# Patient Record
Sex: Female | Born: 1980 | Race: Black or African American | Hispanic: No | Marital: Single | Smoking: Never smoker
Health system: Southern US, Community
[De-identification: ages and names within clinical notes are randomized; demographics above are authoritative.]

## PROBLEM LIST (undated history)

## (undated) ENCOUNTER — Ambulatory Visit (HOSPITAL_COMMUNITY): Admission: EM | Payer: Medicaid Other

## (undated) DIAGNOSIS — O352XX1 Maternal care for (suspected) hereditary disease in fetus, fetus 1: Secondary | ICD-10-CM

## (undated) HISTORY — PX: ADENOIDECTOMY: SUR15

---

## 2004-04-12 ENCOUNTER — Ambulatory Visit (HOSPITAL_COMMUNITY): Admission: RE | Admit: 2004-04-12 | Discharge: 2004-04-12 | Payer: Self-pay | Admitting: Obstetrics and Gynecology

## 2004-05-07 ENCOUNTER — Ambulatory Visit (HOSPITAL_COMMUNITY): Admission: RE | Admit: 2004-05-07 | Discharge: 2004-05-07 | Payer: Self-pay | Admitting: Obstetrics and Gynecology

## 2004-05-19 ENCOUNTER — Inpatient Hospital Stay (HOSPITAL_COMMUNITY): Admission: AD | Admit: 2004-05-19 | Discharge: 2004-05-21 | Payer: Self-pay | Admitting: Obstetrics and Gynecology

## 2004-08-06 ENCOUNTER — Other Ambulatory Visit: Admission: RE | Admit: 2004-08-06 | Discharge: 2004-08-06 | Payer: Self-pay | Admitting: Obstetrics and Gynecology

## 2004-09-15 ENCOUNTER — Emergency Department (HOSPITAL_COMMUNITY): Admission: EM | Admit: 2004-09-15 | Discharge: 2004-09-15 | Payer: Self-pay | Admitting: Emergency Medicine

## 2005-04-11 ENCOUNTER — Emergency Department (HOSPITAL_COMMUNITY): Admission: EM | Admit: 2005-04-11 | Discharge: 2005-04-11 | Payer: Self-pay | Admitting: Emergency Medicine

## 2006-02-03 ENCOUNTER — Emergency Department (HOSPITAL_COMMUNITY): Admission: EM | Admit: 2006-02-03 | Discharge: 2006-02-04 | Payer: Self-pay | Admitting: Emergency Medicine

## 2010-10-25 ENCOUNTER — Emergency Department (HOSPITAL_COMMUNITY)
Admission: EM | Admit: 2010-10-25 | Discharge: 2010-10-26 | Disposition: A | Discharge status: Home / Self Care | Payer: Self-pay | Attending: Emergency Medicine | Admitting: Emergency Medicine

## 2010-10-25 DIAGNOSIS — R229 Localized swelling, mass and lump, unspecified: Secondary | ICD-10-CM | POA: Insufficient documentation

## 2010-10-25 DIAGNOSIS — M79609 Pain in unspecified limb: Secondary | ICD-10-CM | POA: Insufficient documentation

## 2010-10-25 DIAGNOSIS — J45909 Unspecified asthma, uncomplicated: Secondary | ICD-10-CM | POA: Insufficient documentation

## 2010-10-25 DIAGNOSIS — IMO0002 Reserved for concepts with insufficient information to code with codable children: Secondary | ICD-10-CM | POA: Insufficient documentation

## 2010-12-20 ENCOUNTER — Emergency Department (HOSPITAL_COMMUNITY)
Admission: EM | Admit: 2010-12-20 | Discharge: 2010-12-20 | Disposition: A | Discharge status: Home Health | Payer: Self-pay | Attending: Emergency Medicine | Admitting: Emergency Medicine

## 2010-12-20 ENCOUNTER — Encounter: Payer: Self-pay | Admitting: Emergency Medicine

## 2010-12-20 DIAGNOSIS — H5710 Ocular pain, unspecified eye: Secondary | ICD-10-CM | POA: Insufficient documentation

## 2010-12-20 DIAGNOSIS — S058X1A Other injuries of right eye and orbit, initial encounter: Secondary | ICD-10-CM

## 2010-12-20 DIAGNOSIS — S0590XA Unspecified injury of unspecified eye and orbit, initial encounter: Secondary | ICD-10-CM | POA: Insufficient documentation

## 2010-12-20 DIAGNOSIS — H538 Other visual disturbances: Secondary | ICD-10-CM | POA: Insufficient documentation

## 2010-12-20 DIAGNOSIS — H53149 Visual discomfort, unspecified: Secondary | ICD-10-CM | POA: Insufficient documentation

## 2010-12-20 DIAGNOSIS — IMO0002 Reserved for concepts with insufficient information to code with codable children: Secondary | ICD-10-CM | POA: Insufficient documentation

## 2010-12-20 MED ORDER — POLYMYXIN B-TRIMETHOPRIM 10000-0.1 UNIT/ML-% OP SOLN: 1.0000 [drp] | OPHTHALMIC | Status: AC

## 2010-12-20 MED ORDER — FLUORESCEIN SODIUM 1 MG OP STRP
1.0000 | ORAL_STRIP | Freq: Once | OPHTHALMIC | Status: AC
  Administered 2010-12-20: 06:00:00 via OPHTHALMIC
  Filled 2010-12-20: qty 1

## 2010-12-20 MED ORDER — TETRACAINE HCL 0.5 % OP SOLN
2.0000 [drp] | Freq: Once | OPHTHALMIC | Status: AC
  Administered 2010-12-20: 2 [drp] via OPHTHALMIC
  Filled 2010-12-20: qty 2

## 2010-12-20 NOTE — ED Provider Notes (Signed)
Medical screening examination/treatment/procedure(s) were performed by non-physician practitioner and as supervising physician I was immediately available for consultation/collaboration.  Olivia Mackie, MD 12/20/10 [phone]

## 2010-12-20 NOTE — ED Provider Notes (Signed)
History   Pt presents to ED with 4 days onset of R eye blurry vision and pain with light exposure.  Sts she was walking around [place] 4 days ago and accidentally hits R eye with hanging cord.  Sts initially it wasn't painful but for the past few days she has notice increases blurry vision and eye tenderness with light exposure.  Denies discharge or rash.  Denies pressure behind eye.  Does not wear contact lens.    CSN: [id number] Arrival date & time: 12/20/2010  4:33 AM   First MD Initiated Contact with Patient 12/20/10 0600      Chief Complaint  Patient presents with  . Eye Injury    (Consider location/radiation/quality/duration/timing/severity/associated sxs/prior treatment) Patient is a 30 y.o. female presenting with eye injury. The history is provided by the patient. No language interpreter was used.  Eye Injury This is a new problem. The current episode started in the past 7 days. The problem occurs constantly. The problem has been gradually worsening. Associated symptoms include a visual change. Pertinent negatives include no fever, headaches, neck pain or rash. She has tried nothing for the symptoms.    Past Medical History  Diagnosis Date  . Asthma     History reviewed. No pertinent past surgical history.  No family history on file.  History  Substance Use Topics  . Smoking status: Never Smoker   . Smokeless tobacco: Not on file  . Alcohol Use: No    OB History    Grav Para Term Preterm Abortions TAB SAB Ect Mult Living                  Review of Systems  Constitutional: Negative for fever.  HENT: Negative for neck pain.   Skin: Negative for rash.  Neurological: Negative for headaches.  All other systems reviewed and are negative.    Allergies  Review of patient's allergies indicates no known allergies.  Home Medications  No current outpatient prescriptions on file.  BP 103/73  Pulse 73  Temp(Src) 98.6 F (37 C) (Oral)  Resp 20  SpO2  98%  Physical Exam  Constitutional: She is oriented to person, place, and time. She appears well-developed and well-nourished.  HENT:  Head: Normocephalic and atraumatic.  Eyes: Conjunctivae, EOM and lids are normal. Pupils are equal, round, and reactive to light. No foreign bodies found. Right eye exhibits no chemosis, no discharge, no exudate and no hordeolum. No foreign body present in the right eye. Left eye exhibits no chemosis, no discharge, no exudate and no hordeolum. No foreign body present in the left eye. Right conjunctiva is not injected. Right conjunctiva has no hemorrhage. Left conjunctiva is not injected. Left conjunctiva has no hemorrhage. No scleral icterus.  Slit lamp exam:      The right eye shows no corneal abrasion, no corneal ulcer, no foreign body, no hyphema and no fluorescein uptake.  Neck: Normal range of motion. Neck supple.  Neurological: She is alert and oriented to person, place, and time.  Skin: Skin is warm and intact.    ED Course  Procedures (including critical care time)  Labs Reviewed - No data to display No results found.   No diagnosis found.    MDM  Pt has recent eye injury when accidentally hits R eye with hanging cord.  No obvious finding on exam.  Visual acuity is better in R eye than L left.  As requested, will prescribed eyedrops and referral to opthalmology.  Fayrene Helper, [place] 12/20/10 [phone]

## 2010-12-20 NOTE — ED Notes (Signed)
PT. REPORTS RIGHT EYE PAIN /BLURRED VISION ONSET LAST Wednesday AFTER ACCIDENTALLY HIT RIGHT EYE AGAINST HANGING CORD,  DENIES DRAINAGE.

## 2010-12-20 NOTE — ED Notes (Signed)
ASSUMED CARE ON PT. INTRODUCED SELF , CALL LIGHT WITHIN REACH , EXPLAINED PROCESS, WAITING FOR EDP/PA EVALUATION .

## 2011-12-08 ENCOUNTER — Encounter (HOSPITAL_COMMUNITY): Payer: Self-pay | Admitting: *Deleted

## 2011-12-08 ENCOUNTER — Emergency Department (HOSPITAL_COMMUNITY)
Admission: EM | Admit: 2011-12-08 | Discharge: 2011-12-09 | Disposition: A | Discharge status: Home / Self Care | Payer: Self-pay | Attending: Emergency Medicine | Admitting: Emergency Medicine

## 2011-12-08 DIAGNOSIS — N949 Unspecified condition associated with female genital organs and menstrual cycle: Secondary | ICD-10-CM | POA: Insufficient documentation

## 2011-12-08 DIAGNOSIS — J45909 Unspecified asthma, uncomplicated: Secondary | ICD-10-CM | POA: Insufficient documentation

## 2011-12-08 DIAGNOSIS — A5901 Trichomonal vulvovaginitis: Secondary | ICD-10-CM | POA: Insufficient documentation

## 2011-12-08 DIAGNOSIS — N898 Other specified noninflammatory disorders of vagina: Secondary | ICD-10-CM | POA: Insufficient documentation

## 2011-12-08 DIAGNOSIS — Z202 Contact with and (suspected) exposure to infections with a predominantly sexual mode of transmission: Secondary | ICD-10-CM | POA: Insufficient documentation

## 2011-12-08 LAB — WET PREP, GENITAL

## 2011-12-08 LAB — URINALYSIS, ROUTINE W REFLEX MICROSCOPIC
Bilirubin Urine: NEGATIVE
Nitrite: NEGATIVE
Protein, ur: NEGATIVE mg/dL

## 2011-12-08 LAB — URINE MICROSCOPIC-ADD ON

## 2011-12-08 MED ORDER — CEFTRIAXONE SODIUM 250 MG IJ SOLR
250.0000 mg | Freq: Once | INTRAMUSCULAR | Status: AC
  Administered 2011-12-08: 250 mg via INTRAMUSCULAR
  Filled 2011-12-08: qty 250

## 2011-12-08 MED ORDER — LIDOCAINE HCL (PF) 1 % IJ SOLN
5.0000 mL | Freq: Once | INTRAMUSCULAR | Status: AC
  Administered 2011-12-08: 1.1 mL
  Filled 2011-12-08: qty 5

## 2011-12-08 MED ORDER — METRONIDAZOLE 500 MG PO TABS: 500.0000 mg | ORAL_TABLET | Freq: Two times a day (BID) | ORAL | Status: DC

## 2011-12-08 MED ORDER — AZITHROMYCIN 1 G PO PACK
1.0000 g | PACK | Freq: Once | ORAL | Status: AC
  Administered 2011-12-08: 1 g via ORAL
  Filled 2011-12-08: qty 1

## 2011-12-08 NOTE — ED Notes (Addendum)
Patient with two weeks of vaginal discharge with odor and about two days ago she started to feel tingling sensation in her vaginal area.  Patient is requesting to be evaluated for stds

## 2011-12-08 NOTE — ED Provider Notes (Signed)
History     CSN: [id number]  Arrival date & time 12/08/11  2149   First MD Initiated Contact with Patient 12/08/11 2158      Chief Complaint  Patient presents with  . Vaginal Discharge    (Consider location/radiation/quality/duration/timing/severity/associated sxs/prior treatment) HPIShanna Lopez is a 31 y.o. female is concerned because she thinks she may have been exposed to an STI because her boyfriend was "messing around with baby mama" and over the last few days she has noticed a foul smelling vaginal discharge that is occasionally yellow. She's had some lower pelvic pain, she's had no nausea or vomiting, she describes his pain as mild to moderate, no vaginal itching, no pain on intercourse, no fevers or chills chest pain or shortness of breath. Symptoms have been constant and unchanged she has not taken anything for them. Her boyfriend is here in the ER for the similar complaints   Past Medical History  Diagnosis Date  . Asthma     History reviewed. No pertinent past surgical history.  No family history on file.  History  Substance Use Topics  . Smoking status: Never Smoker   . Smokeless tobacco: Not on file  . Alcohol Use: No    OB History    Grav Para Term Preterm Abortions TAB SAB Ect Mult Living                  Review of Systems At least 10pt or greater review of systems completed and are negative except where specified in the HPI.  Allergies  Review of patient's allergies indicates no known allergies.  Home Medications   Current Outpatient Rx  Name  Route  Sig  Dispense  Refill  . ALBUTEROL SULFATE HFA 108 (90 BASE) MCG/ACT IN AERS   Inhalation   Inhale 2 puffs into the lungs every 6 (six) hours as needed. For wheezing         . METRONIDAZOLE 500 MG PO TABS   Oral   Take 1 tablet (500 mg total) by mouth 2 (two) times daily.   14 tablet   0     BP 117/87  Pulse 75  Temp 98.6 F (37 C) (Oral)  Resp 16  SpO2 98%  Physical Exam  Nursing  notes reviewed.  Electronic medical record reviewed. VITAL SIGNS:   Filed Vitals:   12/08/11 2155  BP: 117/87  Pulse: 75  Temp: 98.6 F (37 C)  TempSrc: Oral  Resp: 16  SpO2: 98%   CONSTITUTIONAL: Awake, oriented, appears non-toxic HENT: Atraumatic, normocephalic, oral mucosa pink and moist, airway patent. Nares patent without drainage. External ears normal. EYES: Conjunctiva clear, EOMI, PERRLA NECK: Trachea midline, non-tender, supple CARDIOVASCULAR: Normal heart rate, Normal rhythm, No murmurs, rubs, gallops PULMONARY/CHEST: Clear to auscultation, no rhonchi, wheezes, or rales. Symmetrical breath sounds. Non-tender. ABDOMINAL: Non-distended, obese, soft, non-tender - no rebound or guarding.  BS normal. NEUROLOGIC: Non-focal, moving all four extremities, no gross sensory or motor deficits. EXTREMITIES: No clubbing, cyanosis, or edema SKIN: Warm, Dry, No erythema, No rash PELVIC EXAM: normal external genitalia, discharge at vulva, vagina has a yellowish mucoid discharge in the vault, cervix appears normal without CMT, uterus and adnexa difficult to palpate due to patient's body habitus ED Course  Procedures (including critical care time)  Labs Reviewed  URINALYSIS, ROUTINE W REFLEX MICROSCOPIC - Abnormal; Notable for the following:    Hgb urine dipstick SMALL (*)     All other components within normal limits  WET PREP, GENITAL -  Abnormal; Notable for the following:    Trich, Wet Prep MODERATE (*)     Clue Cells Wet Prep HPF POC FEW (*)     All other components within normal limits  POCT PREGNANCY, URINE  URINE MICROSCOPIC-ADD ON  GC/CHLAMYDIA PROBE AMP   No results found.   1. Trichomonal vulvovaginitis   2. Exposure to sexually transmitted disease (STD)   3. Vaginal discharge       MDM  Mikayla Lopez is a 31 y.o. female presenting with symptoms consistent with sexually transmitted infection. Pelvic exam her is consistent with STI -  We'll treat for trichomoniasis, GC  and Chlamydia. Wet prep does show trichomonas, discussed treatment plan with patient she agrees and understands. She understands not to drink alcohol while taking Flagyl. Also encouraged the patient to get tested for HIV and syphilis.  Patient being treated in the ER now.  I explained the diagnosis and have given explicit precautions to return to the ER including any other new or worsening symptoms. The patient understands and accepts the medical plan as it's been dictated and I have answered their questions. Discharge instructions concerning home care and prescriptions have been given.  The patient is STABLE and is discharged to home in good condition.          Jones Skene, MD 12/08/11 2346

## 2011-12-09 LAB — GC/CHLAMYDIA PROBE AMP
CT Probe RNA: NEGATIVE
GC Probe RNA: NEGATIVE

## 2012-03-16 ENCOUNTER — Emergency Department (HOSPITAL_COMMUNITY)
Admission: EM | Admit: 2012-03-16 | Discharge: 2012-03-17 | Disposition: A | Discharge status: Home / Self Care | Payer: Self-pay | Attending: Emergency Medicine | Admitting: Emergency Medicine

## 2012-03-16 ENCOUNTER — Encounter (HOSPITAL_COMMUNITY): Payer: Self-pay | Admitting: Emergency Medicine

## 2012-03-16 DIAGNOSIS — J45909 Unspecified asthma, uncomplicated: Secondary | ICD-10-CM | POA: Insufficient documentation

## 2012-03-16 DIAGNOSIS — R630 Anorexia: Secondary | ICD-10-CM | POA: Insufficient documentation

## 2012-03-16 DIAGNOSIS — K5289 Other specified noninfective gastroenteritis and colitis: Secondary | ICD-10-CM | POA: Insufficient documentation

## 2012-03-16 DIAGNOSIS — K529 Noninfective gastroenteritis and colitis, unspecified: Secondary | ICD-10-CM

## 2012-03-16 DIAGNOSIS — K920 Hematemesis: Secondary | ICD-10-CM | POA: Insufficient documentation

## 2012-03-16 DIAGNOSIS — N760 Acute vaginitis: Secondary | ICD-10-CM | POA: Insufficient documentation

## 2012-03-16 DIAGNOSIS — Z3202 Encounter for pregnancy test, result negative: Secondary | ICD-10-CM | POA: Insufficient documentation

## 2012-03-16 DIAGNOSIS — B9689 Other specified bacterial agents as the cause of diseases classified elsewhere: Secondary | ICD-10-CM

## 2012-03-16 LAB — CBC WITH DIFFERENTIAL/PLATELET
Basophils Relative: 0 % (ref 0–1)
Eosinophils Relative: 0 % (ref 0–5)
Lymphocytes Relative: 15 % (ref 12–46)
MCV: 83.6 fL (ref 78.0–100.0)
Monocytes Absolute: 0.2 10*3/uL (ref 0.1–1.0)
Neutro Abs: 4.3 10*3/uL (ref 1.7–7.7)
Neutrophils Relative %: 81 % — ABNORMAL HIGH (ref 43–77)
Platelets: 298 10*3/uL (ref 150–400)
RBC: 4.58 MIL/uL (ref 3.87–5.11)

## 2012-03-16 LAB — POCT PREGNANCY, URINE: Preg Test, Ur: NEGATIVE

## 2012-03-16 LAB — WET PREP, GENITAL
Trich, Wet Prep: NONE SEEN
Yeast Wet Prep HPF POC: NONE SEEN

## 2012-03-16 LAB — URINALYSIS, ROUTINE W REFLEX MICROSCOPIC
Hgb urine dipstick: NEGATIVE
Ketones, ur: NEGATIVE mg/dL
Nitrite: NEGATIVE
Specific Gravity, Urine: 1.011 (ref 1.005–1.030)
Urobilinogen, UA: 0.2 mg/dL (ref 0.0–1.0)

## 2012-03-16 LAB — BASIC METABOLIC PANEL
BUN: 7 mg/dL (ref 6–23)
Chloride: 103 mEq/L (ref 96–112)
Creatinine, Ser: 0.58 mg/dL (ref 0.50–1.10)
Potassium: 3.7 mEq/L (ref 3.5–5.1)

## 2012-03-16 LAB — LIPASE, BLOOD: Lipase: 30 U/L (ref 11–59)

## 2012-03-16 MED ORDER — IOHEXOL 300 MG/ML  SOLN
20.0000 mL | INTRAMUSCULAR | Status: AC
  Administered 2012-03-16: 50 mL via ORAL

## 2012-03-16 MED ORDER — SODIUM CHLORIDE 0.9 % IV BOLUS (SEPSIS)
1000.0000 mL | INTRAVENOUS | Status: AC
  Administered 2012-03-16: 1000 mL via INTRAVENOUS

## 2012-03-16 MED ORDER — SODIUM CHLORIDE 0.9 % IV SOLN
Freq: Once | INTRAVENOUS | Status: AC
  Administered 2012-03-16: 21:00:00 via INTRAVENOUS

## 2012-03-16 MED ORDER — MORPHINE SULFATE 4 MG/ML IJ SOLN
6.0000 mg | Freq: Once | INTRAMUSCULAR | Status: AC
  Administered 2012-03-16: 6 mg via INTRAVENOUS
  Filled 2012-03-16: qty 2

## 2012-03-16 MED ORDER — ONDANSETRON HCL 4 MG/2ML IJ SOLN
4.0000 mg | Freq: Once | INTRAMUSCULAR | Status: AC
  Administered 2012-03-16: 4 mg via INTRAVENOUS
  Filled 2012-03-16: qty 2

## 2012-03-16 NOTE — ED Notes (Signed)
Pt placed back on cardiac monitor.

## 2012-03-16 NOTE — ED Notes (Signed)
Pt still drinking 1st cup of contrast at this time.

## 2012-03-16 NOTE — ED Provider Notes (Signed)
Plains of periumbilical pain onset approximately 11 AM today constant patient states she threw up a small amount blood work today she is presently admits to anorexia. Last bowel movement today,, no diarrhea. No fever. No other complaint on exam alert nontoxic appearing abdomen obese mild diffuse tenderness normal bowel sounds  Doug Sou, MD 03/16/12 2108

## 2012-03-16 NOTE — ED Notes (Signed)
Pelvic cart by bedside. ?

## 2012-03-16 NOTE — ED Notes (Signed)
Pelvic setup by bedside. MD notified.

## 2012-03-16 NOTE — ED Notes (Signed)
Pt c/o N/V and abd pain starting today; pt sts she may have thrown up some blood

## 2012-03-16 NOTE — ED Provider Notes (Signed)
History     CSN: [id number]  Arrival date & time 03/16/12  1749   First MD Initiated Contact with Patient 03/16/12 1855      Chief Complaint  Patient presents with  . Abdominal Pain  . Emesis    (Consider location/radiation/quality/duration/timing/severity/associated sxs/prior treatment) Patient is a 32 y.o. female presenting with GI illness and abdominal pain. The history is provided by the patient. No language interpreter was used.  GI Problem This is a new problem. The current episode started today. The problem occurs constantly. The problem has been unchanged. Associated symptoms include abdominal pain, anorexia, a change in bowel habit, nausea and vomiting. Pertinent negatives include no arthralgias, chest pain, chills, congestion, coughing, diaphoresis, fatigue, fever, headaches, neck pain, numbness, sore throat or weakness. Nothing aggravates the symptoms. She has tried nothing for the symptoms. The treatment provided no relief.  Abdominal Pain Pain location:  Periumbilical Pain quality: cramping   Pain radiates to:  Does not radiate Pain severity:  Severe Onset quality:  Gradual Duration:  17 hours Timing:  Constant Progression:  Unchanged Chronicity:  New Context: suspicious food intake   Relieved by:  Nothing Worsened by:  Nothing tried Associated symptoms: anorexia, hematemesis, nausea and vomiting   Associated symptoms: no belching, no chest pain, no chills, no constipation, no cough, no diarrhea, no dysuria, no fatigue, no fever, no shortness of breath, no sore throat, no vaginal bleeding and no vaginal discharge  Hematuria: possibly.   Nausea:    Severity:  Moderate   Onset quality:  Gradual   Timing:  Constant Vomiting:    Quality:  Undigested food and bright red blood   Number of occurrences:  3   Severity:  Moderate   Timing:  Intermittent   Progression:  Resolved   Past Medical History  Diagnosis Date  . Asthma     History reviewed. No pertinent  past surgical history.  History reviewed. No pertinent family history.  History  Substance Use Topics  . Smoking status: Never Smoker   . Smokeless tobacco: Not on file  . Alcohol Use: No    OB History   Grav Para Term Preterm Abortions TAB SAB Ect Mult Living                  Review of Systems  Constitutional: Negative for fever, chills, diaphoresis, activity change, appetite change and fatigue.  HENT: Negative for congestion, sore throat, facial swelling, rhinorrhea, neck pain and neck stiffness.   Eyes: Negative for photophobia and discharge.  Respiratory: Negative for cough, chest tightness and shortness of breath.   Cardiovascular: Negative for chest pain, palpitations and leg swelling.  Gastrointestinal: Positive for nausea, vomiting, abdominal pain, anorexia, change in bowel habit and hematemesis. Negative for diarrhea and constipation.  Endocrine: Negative for polydipsia and polyuria.  Genitourinary: Negative for dysuria, frequency, vaginal bleeding, vaginal discharge, difficulty urinating and pelvic pain. Hematuria: possibly.  Musculoskeletal: Negative for back pain and arthralgias.  Skin: Negative for color change and wound.  Allergic/Immunologic: Negative for immunocompromised state.  Neurological: Negative for facial asymmetry, weakness, numbness and headaches.  Hematological: Does not bruise/bleed easily.  Psychiatric/Behavioral: Negative for confusion and agitation.    Allergies  Review of patient's allergies indicates no known allergies.  Home Medications   Current Outpatient Rx  Name  Route  Sig  Dispense  Refill  . metroNIDAZOLE (FLAGYL) 500 MG tablet   Oral   Take 1 tablet (500 mg total) by mouth 2 (two) times daily. One po  bid x 7 days   14 tablet   0   . ondansetron (ZOFRAN) 4 MG tablet   Oral   Take 1 tablet (4 mg total) by mouth every 6 (six) hours.   12 tablet   0     BP 105/73  Pulse 57  Temp(Src) 98.4 F (36.9 C) (Oral)  Resp 16   SpO2 100%  Physical Exam  Constitutional: She is oriented to person, place, and time. She appears well-developed and well-nourished. No distress.  HENT:  Head: Normocephalic and atraumatic.  Mouth/Throat: No oropharyngeal exudate.  Eyes: Pupils are equal, round, and reactive to light.  Neck: Normal range of motion. Neck supple.  Cardiovascular: Normal rate, regular rhythm and normal heart sounds.  Exam reveals no gallop and no friction rub.   No murmur heard. Pulmonary/Chest: Effort normal and breath sounds normal. No respiratory distress. She has no wheezes. She has no rales.  Abdominal: Soft. Bowel sounds are normal. She exhibits no distension and no mass. There is tenderness in the periumbilical area. There is no rigidity, no rebound and no guarding.  Musculoskeletal: Normal range of motion. She exhibits no edema and no tenderness.  Neurological: She is alert and oriented to person, place, and time.  Skin: Skin is warm and dry.  Psychiatric: She has a normal mood and affect.    ED Course  Procedures (including critical care time)  Labs Reviewed  WET PREP, GENITAL - Abnormal; Notable for the following:    Clue Cells Wet Prep HPF POC FEW (*)    WBC, Wet Prep HPF POC MODERATE (*)    All other components within normal limits  CBC WITH DIFFERENTIAL - Abnormal; Notable for the following:    Neutrophils Relative 81 (*)    All other components within normal limits  GC/CHLAMYDIA PROBE AMP  BASIC METABOLIC PANEL  LIPASE, BLOOD  URINALYSIS, ROUTINE W REFLEX MICROSCOPIC  POCT PREGNANCY, URINE   Ct Abdomen Pelvis W Contrast  03/17/2012  *RADIOLOGY REPORT*  Clinical Data: Nausea, vomiting, and abdominal pain.  Possible hematemesis.  CT ABDOMEN AND PELVIS WITH CONTRAST  Technique:  Multidetector CT imaging of the abdomen and pelvis was performed following the standard protocol during bolus administration of intravenous contrast.  Contrast: OMNIPAQUE IOHEXOL 300 MG/ML  SOLN  Comparison:  None.  Findings: Slight fibrosis or atelectasis in the lung bases.  There is a focal sub centimeter low attenuation lesion in the anterior segment of the right lobe of the liver.  This is nonspecific but likely represent a small cyst or hemangioma.  No other focal liver lesions are demonstrated.  The spleen, gallbladder, adrenal glands, kidneys, abdominal aorta, and retroperitoneal lymph nodes are unremarkable.  Pancreas is normal. The stomach demonstrates normal distension without wall thickening. Small bowel are mostly decompressed.  Stool filled colon without abnormal distension.  There are air-fluid levels in the proximal colon suggesting liquid stool.  This may be due to infectious or inflammatory process.  No free air or free fluid in the abdomen. Mesenteric lymph nodes are present without pathologic enlargement, likely reactive.  Pelvis:  The bladder is decompressed.  Intrauterine device present. The no evidence of enlargement of the uterus or adnexal structures. No free or loculated pelvic fluid collections.  No significant pelvic lymphadenopathy.  Normal alignment of the lumbar vertebrae. No destructive bone lesions appreciated.  IMPRESSION: Liquid stool in the colon may be associated with infectious or inflammatory process.  No colonic wall thickening.  No evidence of bowel obstruction.  Original Report Authenticated By: Burman Nieves, M.D.      1. Gastroenteritis   2. Bacterial vaginosis       MDM  Pt is a 31 y.o. female with pertinent PMHX of asthma who presents with ab pain, n/v beginning about 17 hrs ago.  Pt had eaten [employer] about 1-2 hours prior.  She has had 3 episodes of emesis total with the third containing several cm's BRB.  No emesis since about 2pm (4 hours).  Ab pain described at periumbilical cramping pain w/o exacerbating or alleviating symptoms.  +anorexia, +dysuria, + loose stool today.  No vag bleeding or d/c, fever.  VSS, pt uncomfortable but nontoxic appearing on exam.   +generalized ttp of abdomen, worse at umbilicus.  Nml bs, no rebound or guarding. Pt reports visitor who is with her ate same thing last night and felt unwell this morning w/o nausea, vomiting, diarrhea.  Given partner with milder symptoms, I feel viral gastroenteritis or food poisoning most likely diagnosis and presence of hematemesis likely Mallory-Weiss tear; however, cannot r/o early appendicitis, PID.  CT ab pelvis ordered and will do pelvic exam.    11:03 PM Pelvic done with moderate white/yellow discharge.  Pt still drinking PO contrast.  CT w/ liquid stool in colon, otherwise benign.  I believe symptoms likely due to gastroenteritis.  Will d/c home w/ rx for zofran & flagyl.  Return precautions given for new or worsening symptoms.   1. Gastroenteritis   2. Bacterial vaginosis      Labs and imaging considered in decision making, reviewed by myself.  Imaging interpreted by radiology. Pt care discussed with my attending, Dr. Ethelda Chick.         Toy Cookey, MD 03/17/12 [phone]

## 2012-03-16 NOTE — ED Notes (Signed)
Pt still drinking first cup of contrast.

## 2012-03-16 NOTE — ED Notes (Signed)
MD at bedside for pelvic exam.

## 2012-03-16 NOTE — ED Notes (Signed)
CT paged - Pt currently drinking 2nd cup of oral contrast.

## 2012-03-17 ENCOUNTER — Emergency Department (HOSPITAL_COMMUNITY): Payer: Self-pay

## 2012-03-17 ENCOUNTER — Encounter (HOSPITAL_COMMUNITY): Payer: Self-pay | Admitting: Radiology

## 2012-03-17 MED ORDER — IOHEXOL 300 MG/ML  SOLN
100.0000 mL | Freq: Once | INTRAMUSCULAR | Status: AC | PRN
  Administered 2012-03-17: 100 mL via INTRAVENOUS

## 2012-03-17 MED ORDER — ONDANSETRON HCL 4 MG PO TABS: 4.0000 mg | ORAL_TABLET | Freq: Four times a day (QID) | ORAL | Status: DC

## 2012-03-17 MED ORDER — METRONIDAZOLE 500 MG PO TABS: 500.0000 mg | ORAL_TABLET | Freq: Two times a day (BID) | ORAL | Status: DC

## 2012-03-17 NOTE — ED Notes (Signed)
Pt placed back on the monitor.

## 2012-03-17 NOTE — ED Notes (Signed)
Pt transported to CT ?

## 2012-03-17 NOTE — ED Provider Notes (Signed)
I have personally seen and examined the patient.  I have discussed the plan of care with the resident.  I have reviewed the documentation on PMH/FH/Soc. History.  I have reviewed the documentation of the resident and agree.  Doug Sou, MD 03/17/12 [phone]

## 2012-03-17 NOTE — ED Notes (Signed)
Pt returned from CT °

## 2012-03-19 LAB — GC/CHLAMYDIA PROBE AMP: CT Probe RNA: NEGATIVE

## 2012-09-12 ENCOUNTER — Emergency Department (HOSPITAL_COMMUNITY): Payer: No Typology Code available for payment source

## 2012-09-12 ENCOUNTER — Emergency Department (HOSPITAL_COMMUNITY)
Admission: EM | Admit: 2012-09-12 | Discharge: 2012-09-12 | Disposition: A | Discharge status: Home / Self Care | Payer: No Typology Code available for payment source | Attending: Emergency Medicine | Admitting: Emergency Medicine

## 2012-09-12 ENCOUNTER — Encounter (HOSPITAL_COMMUNITY): Payer: Self-pay | Admitting: Emergency Medicine

## 2012-09-12 DIAGNOSIS — S0993XA Unspecified injury of face, initial encounter: Secondary | ICD-10-CM | POA: Insufficient documentation

## 2012-09-12 DIAGNOSIS — S0100XA Unspecified open wound of scalp, initial encounter: Secondary | ICD-10-CM | POA: Insufficient documentation

## 2012-09-12 DIAGNOSIS — Z23 Encounter for immunization: Secondary | ICD-10-CM | POA: Insufficient documentation

## 2012-09-12 DIAGNOSIS — S298XXA Other specified injuries of thorax, initial encounter: Secondary | ICD-10-CM | POA: Insufficient documentation

## 2012-09-12 DIAGNOSIS — S3981XA Other specified injuries of abdomen, initial encounter: Secondary | ICD-10-CM | POA: Insufficient documentation

## 2012-09-12 DIAGNOSIS — J45909 Unspecified asthma, uncomplicated: Secondary | ICD-10-CM | POA: Insufficient documentation

## 2012-09-12 DIAGNOSIS — S0191XA Laceration without foreign body of unspecified part of head, initial encounter: Secondary | ICD-10-CM

## 2012-09-12 DIAGNOSIS — T148XXA Other injury of unspecified body region, initial encounter: Secondary | ICD-10-CM

## 2012-09-12 DIAGNOSIS — R109 Unspecified abdominal pain: Secondary | ICD-10-CM | POA: Insufficient documentation

## 2012-09-12 DIAGNOSIS — S5010XA Contusion of unspecified forearm, initial encounter: Secondary | ICD-10-CM | POA: Insufficient documentation

## 2012-09-12 DIAGNOSIS — IMO0002 Reserved for concepts with insufficient information to code with codable children: Secondary | ICD-10-CM | POA: Insufficient documentation

## 2012-09-12 LAB — URINALYSIS, ROUTINE W REFLEX MICROSCOPIC
Glucose, UA: NEGATIVE mg/dL
Ketones, ur: NEGATIVE mg/dL
Protein, ur: NEGATIVE mg/dL

## 2012-09-12 LAB — URINE MICROSCOPIC-ADD ON

## 2012-09-12 MED ORDER — MORPHINE SULFATE 4 MG/ML IJ SOLN
4.0000 mg | Freq: Once | INTRAMUSCULAR | Status: AC
  Administered 2012-09-12: 4 mg via INTRAVENOUS
  Filled 2012-09-12: qty 1

## 2012-09-12 MED ORDER — OXYCODONE-ACETAMINOPHEN 5-325 MG PO TABS: 2.0000 | ORAL_TABLET | ORAL | Status: DC | PRN

## 2012-09-12 MED ORDER — IOHEXOL 300 MG/ML  SOLN
80.0000 mL | Freq: Once | INTRAMUSCULAR | Status: AC | PRN
  Administered 2012-09-12: 80 mL via INTRAVENOUS

## 2012-09-12 MED ORDER — SODIUM CHLORIDE 0.9 % IV BOLUS (SEPSIS)
1000.0000 mL | Freq: Once | INTRAVENOUS | Status: AC
  Administered 2012-09-12: 1000 mL via INTRAVENOUS

## 2012-09-12 MED ORDER — TETANUS-DIPHTH-ACELL PERTUSSIS 5-2.5-18.5 LF-MCG/0.5 IM SUSP
0.5000 mL | Freq: Once | INTRAMUSCULAR | Status: AC
  Administered 2012-09-12: 0.5 mL via INTRAMUSCULAR
  Filled 2012-09-12: qty 0.5

## 2012-09-12 NOTE — ED Notes (Signed)
[place], MD at bedside with suture cart.

## 2012-09-12 NOTE — ED Notes (Signed)
Pt walked with steady with assitance with nurse tech.she was crying.walking slow  In much pain.
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
## 2012-09-12 NOTE — ED Notes (Signed)
Pt states that she is having neck and back pain. Her back hurts to the touch in the lower mid and left sides, as well as the upper right side. No visible injury or bruising. Pt also has a 1 cm laceration on the right side oh her forehead.

## 2012-10-22 DIAGNOSIS — R0789 Other chest pain: Secondary | ICD-10-CM | POA: Insufficient documentation

## 2012-10-22 DIAGNOSIS — R21 Rash and other nonspecific skin eruption: Secondary | ICD-10-CM | POA: Insufficient documentation

## 2012-10-22 DIAGNOSIS — J45909 Unspecified asthma, uncomplicated: Secondary | ICD-10-CM | POA: Insufficient documentation

## 2012-10-23 ENCOUNTER — Emergency Department (HOSPITAL_COMMUNITY)
Admission: EM | Admit: 2012-10-23 | Discharge: 2012-10-23 | Disposition: A | Discharge status: Home / Self Care | Payer: Self-pay | Attending: Emergency Medicine | Admitting: Emergency Medicine

## 2012-10-23 ENCOUNTER — Encounter (HOSPITAL_COMMUNITY): Payer: Self-pay | Admitting: Adult Health

## 2012-10-23 DIAGNOSIS — T7840XA Allergy, unspecified, initial encounter: Secondary | ICD-10-CM

## 2012-10-23 LAB — CBC
HCT: 37.7 % (ref 36.0–46.0)
RDW: 13.7 % (ref 11.5–15.5)
WBC: 5.3 10*3/uL (ref 4.0–10.5)

## 2012-10-23 LAB — POCT I-STAT, CHEM 8
BUN: 11 mg/dL (ref 6–23)
Calcium, Ion: 1.2 mmol/L (ref 1.12–1.23)
Chloride: 107 mEq/L (ref 96–112)
Glucose, Bld: 111 mg/dL — ABNORMAL HIGH (ref 70–99)
HCT: 40 % (ref 36.0–46.0)
Sodium: 140 mEq/L (ref 135–145)
TCO2: 23 mmol/L (ref 0–100)

## 2012-10-23 MED ORDER — DIPHENHYDRAMINE HCL 25 MG PO CAPS
50.0000 mg | ORAL_CAPSULE | Freq: Once | ORAL | Status: AC
  Administered 2012-10-23: 50 mg via ORAL
  Filled 2012-10-23: qty 2

## 2012-10-23 MED ORDER — PREDNISONE 20 MG PO TABS
60.0000 mg | ORAL_TABLET | Freq: Once | ORAL | Status: AC
  Administered 2012-10-23: 60 mg via ORAL
  Filled 2012-10-23: qty 3

## 2012-10-23 MED ORDER — ALBUTEROL SULFATE HFA 108 (90 BASE) MCG/ACT IN AERS
2.0000 | INHALATION_SPRAY | RESPIRATORY_TRACT | Status: DC | PRN
  Administered 2012-10-23: 2 via RESPIRATORY_TRACT
  Filled 2012-10-23: qty 6.7

## 2012-10-23 MED ORDER — PERMETHRIN 5 % EX CREA: TOPICAL_CREAM | CUTANEOUS | Status: DC

## 2012-10-23 MED ORDER — DIPHENHYDRAMINE HCL 25 MG PO TABS: 25.0000 mg | ORAL_TABLET | Freq: Four times a day (QID) | ORAL | Status: DC

## 2012-10-23 MED ORDER — PREDNISONE 20 MG PO TABS: 60.0000 mg | ORAL_TABLET | Freq: Every day | ORAL | Status: DC

## 2012-10-23 MED ORDER — FAMOTIDINE 20 MG PO TABS
40.0000 mg | ORAL_TABLET | Freq: Once | ORAL | Status: AC
  Administered 2012-10-23: 40 mg via ORAL
  Filled 2012-10-23: qty 2

## 2012-10-23 NOTE — ED Notes (Addendum)
Presents with rash to back, bilateral forearms, bilateral legs and hands. Began last night, pt c/o feeling itchy. Sats 100% RA, bilateral breath sounds clear. Pt is unsure what is causing reaction. She feeling like her chest is tight when breathing.

## 2012-10-23 NOTE — ED Provider Notes (Signed)
CSN: [id number]     Arrival date & time 10/22/12  2347 History   First MD Initiated Contact with Patient 10/23/12 [phone]     Chief Complaint  Patient presents with  . Allergic Reaction   (Consider location/radiation/quality/duration/timing/severity/associated sxs/prior Treatment) HPI History provided by patient. Has a history of asthma but currently out of her inhaler and does not take any other medications. No new soaps or known exposures. Yesterday developed itchy rash involving the extremities and torso. She had some chest tightness earlier now resolved. No throat, tongue or lip swelling. No new foods or detergents. No known sick contacts. No recent travel. Past Medical History  Diagnosis Date  . Asthma    History reviewed. No pertinent past surgical history. History reviewed. No pertinent family history. History  Substance Use Topics  . Smoking status: Never Smoker   . Smokeless tobacco: Not on file  . Alcohol Use: Yes     Comment: Pt states socially   OB History   Grav Para Term Preterm Abortions TAB SAB Ect Mult Living                 Review of Systems  Constitutional: Negative for fever and chills.  HENT: Negative for neck pain and neck stiffness.   Eyes: Negative for pain.  Respiratory: Positive for chest tightness. Negative for shortness of breath.   Cardiovascular: Negative for chest pain.  Gastrointestinal: Negative for abdominal pain.  Genitourinary: Negative for dysuria.  Musculoskeletal: Negative for back pain.  Skin: Positive for rash.  Neurological: Negative for headaches.  All other systems reviewed and are negative.    Allergies  Review of patient's allergies indicates no known allergies.  Home Medications  No current outpatient prescriptions on file. BP 171/149  Pulse 18  Temp(Src) 98.1 F (36.7 C) (Oral)  Resp 18  Ht 5\' 3"  (1.6 m)  Wt 179 lb (81.194 kg)  BMI 31.72 kg/m2  SpO2 100% Physical Exam  Constitutional: She is oriented to person,  place, and time. She appears well-developed and well-nourished.  HENT:  Head: Normocephalic and atraumatic.  Eyes: EOM are normal. Pupils are equal, round, and reactive to light.  Neck: Neck supple.  Cardiovascular: Normal rate, regular rhythm and intact distal pulses.   Pulmonary/Chest: Effort normal and breath sounds normal. No respiratory distress. She has no wheezes. She exhibits no tenderness.  Abdominal: Soft. Bowel sounds are normal. There is no tenderness.  Musculoskeletal: Normal range of motion. She exhibits no edema.  Neurological: She is alert and oriented to person, place, and time.  Skin: Skin is warm and dry.  Erythematous rash to torso and extremities sparing the palms. No oral lesions. Excoriated areas of rash are 5-10 mm in diameter, macular, nontender    ED Course  Procedures (including critical care time) Labs Review Labs Reviewed  POCT I-STAT, CHEM 8 - Abnormal; Notable for the following:    Glucose, Bld 111 (*)    All other components within normal limits  CBC    Albuterol inhaler provided. Prednisone, Benadryl, Pepcid provided  Symptomatically improved on recheck. No airway involvement.  Plan discharge home with prescription for prednisone and Benadryl as needed. Strict return precautions verbalized is understood. Patient is here with significant other who was recently treated for scabies, prescription for Elimite provided MDM  Diagnosis: Allergic reaction  Labs and vital signs and nursing notes reviewed Improved with medications  Sunnie Nielsen, MD 10/23/12 [phone]

## 2012-10-23 NOTE — ED Notes (Signed)
Upon arriving into pts room, pt is sleeping. This RN attempted to wake pt up and pt opened her eyes but would not sit up or talk to this RN for the assessment.

## 2013-07-29 ENCOUNTER — Ambulatory Visit (INDEPENDENT_AMBULATORY_CARE_PROVIDER_SITE_OTHER): Payer: Self-pay | Admitting: Obstetrics & Gynecology

## 2013-07-29 ENCOUNTER — Encounter: Payer: Self-pay | Admitting: Obstetrics & Gynecology

## 2013-07-29 VITALS — BP 110/79 | HR 68 | Temp 98.2°F

## 2013-07-29 DIAGNOSIS — Z3009 Encounter for other general counseling and advice on contraception: Secondary | ICD-10-CM

## 2013-07-29 DIAGNOSIS — Z30432 Encounter for removal of intrauterine contraceptive device: Secondary | ICD-10-CM

## 2013-07-29 MED ORDER — MEDROXYPROGESTERONE ACETATE 150 MG/ML IM SUSP: 150.0000 mg | Freq: Once | INTRAMUSCULAR | Status: DC

## 2013-07-29 MED ORDER — NORETHIN ACE-ETH ESTRAD-FE 1-20 MG-MCG(24) PO TABS: 1.0000 | ORAL_TABLET | Freq: Every day | ORAL | Status: DC

## 2013-07-29 NOTE — Addendum Note (Signed)
Addended by: Allie BossierVE, Michell Giuliano C on: 07/29/2013 04:12 PM   Modules accepted: Orders

## 2013-07-29 NOTE — Progress Notes (Addendum)
   Subjective:    Patient ID: Mikayla Lopez, female    DOB: 11-08-1980, 33 y.o.   MRN: [id number]  HPI  33 yo AA lady who is here to have her Mirena IUD removed. It has been in since 2006. She would like to use depo provera for contraception. She has used this method in the past.  Review of Systems     Objective:   Physical Exam  Her cervix was prepped with betadine and the Mirena was easily removed and noted to be intact. She tolerated the procedure well.      Assessment & Plan:  Contraception- since she does not have insurance, she would like to use OCPs as she has done in the past. I have prescribed generic lo estrin. [facility]. Preventative care- RTC 12 weeks for annual and depo injection

## 2013-09-17 ENCOUNTER — Encounter: Payer: Self-pay | Admitting: General Practice

## 2013-11-25 ENCOUNTER — Encounter: Payer: Self-pay | Admitting: Obstetrics & Gynecology

## 2015-01-08 ENCOUNTER — Encounter (HOSPITAL_COMMUNITY): Payer: Self-pay | Admitting: Emergency Medicine

## 2015-01-08 ENCOUNTER — Emergency Department (HOSPITAL_COMMUNITY)
Admission: EM | Admit: 2015-01-08 | Discharge: 2015-01-09 | Disposition: A | Discharge status: Home / Self Care | Payer: Self-pay | Attending: Emergency Medicine | Admitting: Emergency Medicine

## 2015-01-08 DIAGNOSIS — R300 Dysuria: Secondary | ICD-10-CM

## 2015-01-08 DIAGNOSIS — R8281 Pyuria: Secondary | ICD-10-CM

## 2015-01-08 DIAGNOSIS — J45909 Unspecified asthma, uncomplicated: Secondary | ICD-10-CM | POA: Insufficient documentation

## 2015-01-08 DIAGNOSIS — Z7952 Long term (current) use of systemic steroids: Secondary | ICD-10-CM | POA: Insufficient documentation

## 2015-01-08 DIAGNOSIS — Z793 Long term (current) use of hormonal contraceptives: Secondary | ICD-10-CM | POA: Insufficient documentation

## 2015-01-08 DIAGNOSIS — Z3202 Encounter for pregnancy test, result negative: Secondary | ICD-10-CM | POA: Insufficient documentation

## 2015-01-08 DIAGNOSIS — N390 Urinary tract infection, site not specified: Secondary | ICD-10-CM | POA: Insufficient documentation

## 2015-01-08 LAB — CBC
HEMATOCRIT: 37.9 % (ref 36.0–46.0)
Hemoglobin: 12.2 g/dL (ref 12.0–15.0)
MCH: 27.2 pg (ref 26.0–34.0)
MCHC: 32.2 g/dL (ref 30.0–36.0)
MCV: 84.6 fL (ref 78.0–100.0)
PLATELETS: 334 10*3/uL (ref 150–400)
RBC: 4.48 MIL/uL (ref 3.87–5.11)
RDW: 14.2 % (ref 11.5–15.5)
WBC: 6.2 10*3/uL (ref 4.0–10.5)

## 2015-01-08 MED ORDER — IBUPROFEN 400 MG PO TABS
600.0000 mg | ORAL_TABLET | Freq: Once | ORAL | Status: AC
  Administered 2015-01-08: 600 mg via ORAL
  Filled 2015-01-08: qty 1

## 2015-01-08 MED ORDER — PHENAZOPYRIDINE HCL 100 MG PO TABS
95.0000 mg | ORAL_TABLET | Freq: Once | ORAL | Status: AC
  Administered 2015-01-08: 100 mg via ORAL
  Filled 2015-01-08: qty 1

## 2015-01-08 NOTE — ED Provider Notes (Signed)
CSN: [id number]     Arrival date & time 01/08/15  2310 History   First MD Initiated Contact with Patient 01/08/15 2325     Chief Complaint  Patient presents with  . Abdominal Pain  . Dysuria  . Hematuria     (Consider location/radiation/quality/duration/timing/severity/associated sxs/prior Treatment) Patient is a 34 y.o. female presenting with abdominal pain, dysuria, and hematuria. The history is provided by the patient. No language interpreter was used.  Abdominal Pain Pain location:  Suprapubic Pain quality: burning and cramping   Pain radiates to:  Does not radiate Pain severity:  Mild Onset quality:  Gradual Duration:  1 day Timing:  Constant Progression:  Worsening Chronicity:  New Context: not sick contacts   Relieved by:  Nothing Ineffective treatments:  None tried Associated symptoms: dysuria and hematuria   Associated symptoms: no diarrhea, no fever, no melena, no vaginal bleeding and no vaginal discharge   Risk factors: not pregnant   Dysuria Pain quality:  Burning Pain severity:  Mild Onset quality:  Gradual Duration:  1 day Timing:  Intermittent Progression:  Worsening Chronicity:  New Recent urinary tract infections: no   Relieved by:  Nothing Ineffective treatments:  None tried Urinary symptoms: foul-smelling urine and hematuria   Associated symptoms: abdominal pain   Associated symptoms: no fever, no flank pain and no vaginal discharge   Risk factors: sexually active   Risk factors: no hx of pyelonephritis, no hx of urolithiasis, no kidney transplant, not pregnant and no sexually transmitted infections   Hematuria Associated symptoms include abdominal pain. Pertinent negatives include no fever.    Past Medical History  Diagnosis Date  . Asthma    History reviewed. No pertinent past surgical history. No family history on file. Social History  Substance Use Topics  . Smoking status: Never Smoker   . Smokeless tobacco: None  . Alcohol Use: Yes      Comment: Pt states socially   OB History    Gravida Para Term Preterm AB TAB SAB Ectopic Multiple Living   3         3      Review of Systems  Constitutional: Negative for fever.  Gastrointestinal: Positive for abdominal pain. Negative for diarrhea and melena.  Genitourinary: Positive for dysuria, urgency, frequency and hematuria. Negative for flank pain, vaginal bleeding and vaginal discharge.       +urinary incontinence  All other systems reviewed and are negative.   Allergies  Review of patient's allergies indicates no known allergies.  Home Medications   Prior to Admission medications   Medication Sig Start Date End Date Taking? Authorizing Provider  cephALEXin (KEFLEX) 500 MG capsule Take 1 capsule (500 mg total) by mouth 2 (two) times daily. 01/09/15   Antony Madura, PA-C  diphenhydrAMINE (BENADRYL) 25 MG tablet Take 1 tablet (25 mg total) by mouth every 6 (six) hours. 10/23/12   Sunnie Nielsen, MD  ibuprofen (ADVIL,MOTRIN) 600 MG tablet Take 1 tablet (600 mg total) by mouth every 6 (six) hours as needed. 01/09/15   Antony Madura, PA-C  Norethindrone Acetate-Ethinyl Estrad-FE (LOESTRIN 24 FE) 1-20 MG-MCG(24) tablet Take 1 tablet by mouth daily. 07/29/13   Allie Bossier, MD  permethrin (ELIMITE) 5 % cream Apply to affected area once 10/23/12   Sunnie Nielsen, MD  predniSONE (DELTASONE) 20 MG tablet Take 3 tablets (60 mg total) by mouth daily. 10/23/12   Sunnie Nielsen, MD   BP 131/58 mmHg  Pulse 75  Temp(Src) 98.5 F (36.9 C) (Oral)  Resp 18  SpO2 100%  LMP 12/31/2014 (Approximate)   Physical Exam  Constitutional: She is oriented to person, place, and time. She appears well-developed and well-nourished. No distress.  Nontoxic/nonseptic appearing  HENT:  Head: Normocephalic and atraumatic.  Eyes: Conjunctivae and EOM are normal. No scleral icterus.  Neck: Normal range of motion.  Cardiovascular: Normal rate, regular rhythm and intact distal pulses.   Pulmonary/Chest: Effort normal.  No respiratory distress. She has no wheezes.  Respirations even and unlabored  Abdominal: Soft. She exhibits no distension. There is tenderness. There is no rebound and no guarding.  Abdomen soft. No masses or peritoneal signs. There is focal tenderness in the suprapubic abdomen.  Musculoskeletal: Normal range of motion.  Neurological: She is alert and oriented to person, place, and time. She exhibits normal muscle tone. Coordination normal.  Skin: Skin is warm and dry. No rash noted. She is not diaphoretic. No erythema. No pallor.  Psychiatric: She has a normal mood and affect. Her behavior is normal.  Nursing note and vitals reviewed.   ED Course  Procedures (including critical care time) Labs Review Labs Reviewed  URINALYSIS, ROUTINE W REFLEX MICROSCOPIC (NOT AT [facility]) - Abnormal; Notable for the following:    Hgb urine dipstick MODERATE (*)    Leukocytes, UA SMALL (*)    All other components within normal limits  URINE MICROSCOPIC-ADD ON - Abnormal; Notable for the following:    Squamous Epithelial / LPF 0-5 (*)    Bacteria, UA RARE (*)    All other components within normal limits  URINE CULTURE  COMPREHENSIVE METABOLIC PANEL  CBC  POC URINE PREG, ED  GC/CHLAMYDIA PROBE AMP ([place]) NOT AT [facility]    Imaging Review No results found.   I have personally reviewed and evaluated these images and lab results as part of my medical decision-making.   EKG Interpretation None      MDM   Final diagnoses:  Dysuria  Pyuria    Pt has been diagnosed with a UTI. Symptoms clinically c/w this diagnosis given dysuria, hematuria, and incontinence, onset today. Hematuria likely due to hemorrhagic cystitis. Urine sent for culture. Pt is afebrile, normotensive, and without associated N/V or CVA tenderness. Will discharge home with antibiotics and instructions to follow up with PCP if symptoms persist. Return precautions given at discharge. Patient discharged in good condition with no  unaddressed concerns.   Filed Vitals:   01/08/15 2315  BP: 131/58  Pulse: 75  Temp: 98.5 F (36.9 C)  TempSrc: Oral  Resp: 18  SpO2: 100%      Antony MaduraKelly Yvett Rossel, PA-C 01/09/15 0038  Benjiman CoreNathan Pickering, MD 01/09/15 2333

## 2015-01-08 NOTE — ED Notes (Signed)
Pt. reports low abdominal pain with dysuria and hematuria onset today , denies nausea or vomitting , no fever or diarrhea.

## 2015-01-09 LAB — URINE MICROSCOPIC-ADD ON

## 2015-01-09 LAB — COMPREHENSIVE METABOLIC PANEL
ALK PHOS: 59 U/L (ref 38–126)
ALT: 16 U/L (ref 14–54)
ANION GAP: 7 (ref 5–15)
AST: 15 U/L (ref 15–41)
Albumin: 3.5 g/dL (ref 3.5–5.0)
BILIRUBIN TOTAL: 0.3 mg/dL (ref 0.3–1.2)
BUN: 16 mg/dL (ref 6–20)
CHLORIDE: 104 mmol/L (ref 101–111)
CO2: 26 mmol/L (ref 22–32)
Calcium: 9.5 mg/dL (ref 8.9–10.3)
Creatinine, Ser: 0.76 mg/dL (ref 0.44–1.00)
GFR calc non Af Amer: >60 mL/min (ref >60)
Glucose, Bld: 98 mg/dL (ref 65–99)
Potassium: 4 mmol/L (ref 3.5–5.1)
Sodium: 137 mmol/L (ref 135–145)
Total Protein: 7.2 g/dL (ref 6.5–8.1)

## 2015-01-09 LAB — GC/CHLAMYDIA PROBE AMP (~~LOC~~) NOT AT ARMC
Chlamydia: NEGATIVE
NEISSERIA GONORRHEA: NEGATIVE

## 2015-01-09 LAB — POC URINE PREG, ED: Preg Test, Ur: NEGATIVE

## 2015-01-09 LAB — URINALYSIS, ROUTINE W REFLEX MICROSCOPIC
Bilirubin Urine: NEGATIVE
GLUCOSE, UA: NEGATIVE mg/dL
Ketones, ur: NEGATIVE mg/dL
Nitrite: NEGATIVE
PH: 6 (ref 5.0–8.0)
PROTEIN: NEGATIVE mg/dL
SPECIFIC GRAVITY, URINE: 1.024 (ref 1.005–1.030)

## 2015-01-09 MED ORDER — CEPHALEXIN 500 MG PO CAPS: 500.0000 mg | ORAL_CAPSULE | Freq: Two times a day (BID) | ORAL | Status: DC

## 2015-01-09 MED ORDER — IBUPROFEN 600 MG PO TABS: 600.0000 mg | ORAL_TABLET | Freq: Four times a day (QID) | ORAL | Status: DC | PRN

## 2015-01-09 MED ORDER — CEPHALEXIN 250 MG PO CAPS
500.0000 mg | ORAL_CAPSULE | Freq: Once | ORAL | Status: AC
  Administered 2015-01-09: 500 mg via ORAL
  Filled 2015-01-09: qty 2

## 2015-01-09 NOTE — Discharge Instructions (Signed)

## 2015-01-10 LAB — URINE CULTURE: Culture: NO GROWTH

## 2016-09-13 ENCOUNTER — Encounter (HOSPITAL_COMMUNITY): Payer: Self-pay

## 2016-09-13 DIAGNOSIS — J45909 Unspecified asthma, uncomplicated: Secondary | ICD-10-CM | POA: Diagnosis not present

## 2016-09-13 DIAGNOSIS — Z3A01 Less than 8 weeks gestation of pregnancy: Secondary | ICD-10-CM | POA: Diagnosis not present

## 2016-09-13 DIAGNOSIS — Z79899 Other long term (current) drug therapy: Secondary | ICD-10-CM | POA: Insufficient documentation

## 2016-09-13 DIAGNOSIS — Z3201 Encounter for pregnancy test, result positive: Secondary | ICD-10-CM | POA: Insufficient documentation

## 2016-09-13 DIAGNOSIS — R110 Nausea: Secondary | ICD-10-CM | POA: Diagnosis present

## 2016-09-13 LAB — COMPREHENSIVE METABOLIC PANEL
ALT: 12 U/L — ABNORMAL LOW (ref 14–54)
AST: 15 U/L (ref 15–41)
Albumin: 3.4 g/dL — ABNORMAL LOW (ref 3.5–5.0)
Alkaline Phosphatase: 52 U/L (ref 38–126)
Anion gap: 10 (ref 5–15)
BUN: 6 mg/dL (ref 6–20)
CO2: 20 mmol/L — AB (ref 22–32)
Calcium: 9.1 mg/dL (ref 8.9–10.3)
Chloride: 105 mmol/L (ref 101–111)
Creatinine, Ser: 0.68 mg/dL (ref 0.44–1.00)
GFR calc Af Amer: >60 mL/min (ref >60)
GFR calc non Af Amer: >60 mL/min (ref >60)
Glucose, Bld: 112 mg/dL — ABNORMAL HIGH (ref 65–99)
POTASSIUM: 3.4 mmol/L — AB (ref 3.5–5.1)
Sodium: 135 mmol/L (ref 135–145)
Total Bilirubin: 0.7 mg/dL (ref 0.3–1.2)
Total Protein: 7.2 g/dL (ref 6.5–8.1)

## 2016-09-13 LAB — URINALYSIS, ROUTINE W REFLEX MICROSCOPIC
BACTERIA UA: NONE SEEN
BILIRUBIN URINE: NEGATIVE
Glucose, UA: NEGATIVE mg/dL
Ketones, ur: 20 mg/dL — AB
LEUKOCYTES UA: NEGATIVE
Nitrite: NEGATIVE
Protein, ur: NEGATIVE mg/dL
SPECIFIC GRAVITY, URINE: 1.021 (ref 1.005–1.030)
SQUAMOUS EPITHELIAL / LPF: NONE SEEN
pH: 6 (ref 5.0–8.0)

## 2016-09-13 LAB — CBC
HEMATOCRIT: 36.2 % (ref 36.0–46.0)
Hemoglobin: 11.8 g/dL — ABNORMAL LOW (ref 12.0–15.0)
MCH: 26.3 pg (ref 26.0–34.0)
MCHC: 32.6 g/dL (ref 30.0–36.0)
MCV: 80.8 fL (ref 78.0–100.0)
Platelets: 410 10*3/uL — ABNORMAL HIGH (ref 150–400)
RBC: 4.48 MIL/uL (ref 3.87–5.11)
RDW: 13.7 % (ref 11.5–15.5)
WBC: 6.2 10*3/uL (ref 4.0–10.5)

## 2016-09-13 LAB — LIPASE, BLOOD: LIPASE: 28 U/L (ref 11–51)

## 2016-09-13 LAB — I-STAT BETA HCG BLOOD, ED (MC, WL, AP ONLY)

## 2016-09-13 MED ORDER — ONDANSETRON 4 MG PO TBDP
4.0000 mg | ORAL_TABLET | Freq: Once | ORAL | Status: AC | PRN
  Administered 2016-09-14: 4 mg via ORAL
  Filled 2016-09-13: qty 1

## 2016-09-13 NOTE — ED Triage Notes (Signed)
Pt endorses nausea with 1 episode of vomiting for several days. Has not been able to eat. LBM yesterday. Denies diarrhea. LMP last month. VSS.

## 2016-09-14 ENCOUNTER — Emergency Department (HOSPITAL_COMMUNITY)
Admission: EM | Admit: 2016-09-14 | Discharge: 2016-09-14 | Disposition: A | Discharge status: Home / Self Care | Payer: Medicaid Other | Attending: Emergency Medicine | Admitting: Emergency Medicine

## 2016-09-14 ENCOUNTER — Emergency Department (HOSPITAL_COMMUNITY): Payer: Medicaid Other

## 2016-09-14 DIAGNOSIS — R110 Nausea: Secondary | ICD-10-CM

## 2016-09-14 DIAGNOSIS — Z3A01 Less than 8 weeks gestation of pregnancy: Secondary | ICD-10-CM

## 2016-09-14 LAB — OB RESULTS CONSOLE GC/CHLAMYDIA: GC PROBE AMP, GENITAL: NEGATIVE

## 2016-09-14 LAB — WET PREP, GENITAL
Sperm: NONE SEEN
TRICH WET PREP: NONE SEEN
YEAST WET PREP: NONE SEEN

## 2016-09-14 LAB — GC/CHLAMYDIA PROBE AMP (~~LOC~~) NOT AT ARMC
Chlamydia: NEGATIVE
Neisseria Gonorrhea: POSITIVE — AB

## 2016-09-14 LAB — HIV ANTIBODY (ROUTINE TESTING W REFLEX): HIV SCREEN 4TH GENERATION: NONREACTIVE

## 2016-09-14 LAB — HCG, QUANTITATIVE, PREGNANCY: hCG, Beta Chain, Quant, S: 30574 m[IU]/mL — ABNORMAL HIGH (ref <5)

## 2016-09-14 LAB — RPR: RPR Ser Ql: NONREACTIVE

## 2016-09-14 MED ORDER — POTASSIUM CHLORIDE CRYS ER 20 MEQ PO TBCR
40.0000 meq | EXTENDED_RELEASE_TABLET | Freq: Once | ORAL | Status: AC
  Administered 2016-09-14: 40 meq via ORAL
  Filled 2016-09-14: qty 2

## 2016-09-14 MED ORDER — SODIUM CHLORIDE 0.9 % IV BOLUS (SEPSIS)
1000.0000 mL | Freq: Once | INTRAVENOUS | Status: AC
  Administered 2016-09-14: 1000 mL via INTRAVENOUS

## 2016-09-14 MED ORDER — DOXYLAMINE-PYRIDOXINE 10-10 MG PO TBEC
DELAYED_RELEASE_TABLET | ORAL | 0 refills | Status: DC
Start: 2016-09-14 — End: 2017-05-05

## 2016-09-14 NOTE — Discharge Instructions (Signed)
1. Medications: Diclegis, usual home medications 2. Treatment: rest, drink plenty of fluids,  3. Follow Up: Please followup with your PCP or set up an OB/GYN appointment in 3-5 days for discussion of your diagnoses and further evaluation after today's visit; if you do not have a primary care doctor use the resource guide provided to find one; Please return to the ER for persistent vomiting, worsening pain, vaginal bleeding or other concerns

## 2016-09-14 NOTE — ED Notes (Signed)
PT states understanding of care given, follow up care, and medication prescribed. PT ambulated from ED to car with a steady gait. 

## 2016-09-14 NOTE — ED Provider Notes (Signed)
[facility]PT Provider Note   CSN: [id number] Arrival date & time: 09/13/16  1750     History   Chief Complaint Chief Complaint  Patient presents with  . Nausea    HPI Mikayla Lopez is a 36 y.o. female [phone] with a hx of asthma presents to the Emergency Department complaining of gradual, persistent, progressively worsening nausea and decreased appetite onset 4 days ago. Associated symptoms include vomiting x 5 with NBNB emesis on Saturday and Sunday but none since that time.  Pt denies international travel, recent sick contacts.  Nothing makes it better and eating and drinking makes it worse.  Pt denies fever, chills, headache, neck pain, chest pain, SOB, abd pain, diarrhea, weakness, dizziness, syncope.  She does report hard stools.  Also denies vaginal discharge, vaginal bleeding.  Pt reports she is sexually active with 2 female partners.  She reports intercourse at the end of July and August. LMP August 15, 2016.      The history is provided by the patient and medical records. No language interpreter was used.    Past Medical History:  Diagnosis Date  . Asthma     There are no active problems to display for this patient.   History reviewed. No pertinent surgical history.  OB History    Gravida Para Term Preterm AB Living   3         3   SAB TAB Ectopic Multiple Live Births           3       Home Medications    Prior to Admission medications   Medication Sig Start Date End Date Taking? Authorizing Provider  cephALEXin (KEFLEX) 500 MG capsule Take 1 capsule (500 mg total) by mouth 2 (two) times daily. 01/09/15   Antony Madura, PA-C  diphenhydrAMINE (BENADRYL) 25 MG tablet Take 1 tablet (25 mg total) by mouth every 6 (six) hours. 10/23/12   Sunnie Nielsen, MD  Doxylamine-Pyridoxine 10-10 MG TBEC Take 2 tabs at bedtime. If symptoms are controlled, continue taking 2 tabs at bedtime. If symptoms persist, take 2 tabs at bedtime, then 1 tab in the morning of Day 3 and 2 tabs  at bedtime. If symptoms are controlled on Day 4, continue as scheduled. If symptoms are not controlled, increase dose to 1 tab in the morning, 1 tab midafternoon, and 2 tabs in the evening. Take as scheduled and not on an as needed basis. Max: 4 tabs daily. 09/14/16   Aliah Eriksson, Dahlia Client, PA-C  ibuprofen (ADVIL,MOTRIN) 600 MG tablet Take 1 tablet (600 mg total) by mouth every 6 (six) hours as needed. 01/09/15   Antony Madura, PA-C  Norethindrone Acetate-Ethinyl Estrad-FE (LOESTRIN 24 FE) 1-20 MG-MCG(24) tablet Take 1 tablet by mouth daily. 07/29/13   Allie Bossier, MD  permethrin (ELIMITE) 5 % cream Apply to affected area once 10/23/12   Sunnie Nielsen, MD  predniSONE (DELTASONE) 20 MG tablet Take 3 tablets (60 mg total) by mouth daily. 10/23/12   Sunnie Nielsen, MD    Family History History reviewed. No pertinent family history.  Social History Social History  Substance Use Topics  . Smoking status: Never Smoker  . Smokeless tobacco: Not on file  . Alcohol use Yes     Comment: Pt states socially     Allergies   Patient has no known allergies.   Review of Systems Review of Systems  Constitutional: Negative for appetite change, diaphoresis, fatigue, fever and unexpected weight change.  HENT: Negative for mouth  sores.   Eyes: Negative for visual disturbance.  Respiratory: Negative for cough, chest tightness, shortness of breath and wheezing.   Cardiovascular: Negative for chest pain.  Gastrointestinal: Positive for abdominal pain ( minimal, lower), nausea and vomiting. Negative for constipation and diarrhea.  Endocrine: Negative for polydipsia, polyphagia and polyuria.  Genitourinary: Negative for dysuria, frequency, hematuria and urgency.  Musculoskeletal: Negative for back pain and neck stiffness.  Skin: Negative for rash.  Allergic/Immunologic: Negative for immunocompromised state.  Neurological: Negative for syncope, light-headedness and headaches.  Hematological: Does not bruise/bleed  easily.  Psychiatric/Behavioral: Negative for sleep disturbance. The patient is not nervous/anxious.   All other systems reviewed and are negative.    Physical Exam Updated Vital Signs BP (!) 146/78 (BP Location: Right Arm)   Pulse 63   Temp 98.8 F (37.1 C) (Oral)   Resp 16   Ht 5\' 3"  (1.6 m)   Wt 84.4 kg (186 lb)   LMP 08/15/2016 (Approximate)   SpO2 100%   BMI 32.95 kg/m   Physical Exam  Constitutional: She appears well-developed and well-nourished. No distress.  Awake, alert, nontoxic appearance  HENT:  Head: Normocephalic and atraumatic.  Mouth/Throat: Oropharynx is clear and moist. No oropharyngeal exudate.  Eyes: Conjunctivae are normal. No scleral icterus.  Neck: Normal range of motion. Neck supple.  Cardiovascular: Normal rate, regular rhythm, normal heart sounds and intact distal pulses.   No murmur heard. Pulmonary/Chest: Effort normal and breath sounds normal. No respiratory distress. She has no wheezes.  Equal chest expansion  Abdominal: Soft. Bowel sounds are normal. She exhibits no mass. There is no tenderness. There is no rebound and no guarding. Hernia confirmed negative in the right inguinal area and confirmed negative in the left inguinal area.  Genitourinary: Uterus normal. No labial fusion. There is no rash, tenderness or lesion on the right labia. There is no rash, tenderness or lesion on the left labia. Uterus is not deviated, not enlarged, not fixed and not tender. Cervix exhibits no motion tenderness, no discharge and no friability. Right adnexum displays no mass, no tenderness and no fullness. Left adnexum displays no mass, no tenderness and no fullness. No erythema, tenderness or bleeding in the vagina. No foreign body in the vagina. No signs of injury around the vagina. Vaginal discharge ( scant, white, nonodorous) found.  Genitourinary Comments: Chaperone present No blood in the vaginal vault Cervical os closed  Musculoskeletal: Normal range of  motion. She exhibits no edema.  Lymphadenopathy:       Right: No inguinal adenopathy present.       Left: No inguinal adenopathy present.  Neurological: She is alert.  Speech is clear and goal oriented Moves extremities without ataxia  Skin: Skin is warm and dry. She is not diaphoretic. No erythema.  Psychiatric: She has a normal mood and affect.  Nursing note and vitals reviewed.    ED Treatments / Results  Labs (all labs ordered are listed, but only abnormal results are displayed) Labs Reviewed  WET PREP, GENITAL - Abnormal; Notable for the following:       Result Value   Clue Cells Wet Prep HPF POC FEW (*)    WBC, Wet Prep HPF POC FEW (*)    All other components within normal limits  COMPREHENSIVE METABOLIC PANEL - Abnormal; Notable for the following:    Potassium 3.4 (*)    CO2 20 (*)    Glucose, Bld 112 (*)    Albumin 3.4 (*)    ALT 12 (*)  All other components within normal limits  CBC - Abnormal; Notable for the following:    Hemoglobin 11.8 (*)    Platelets 410 (*)    All other components within normal limits  URINALYSIS, ROUTINE W REFLEX MICROSCOPIC - Abnormal; Notable for the following:    Hgb urine dipstick SMALL (*)    Ketones, ur 20 (*)    All other components within normal limits  HCG, QUANTITATIVE, PREGNANCY - Abnormal; Notable for the following:    hCG, Beta Chain, Quant, S 30,574 (*)    All other components within normal limits  I-STAT BETA HCG BLOOD, ED ([facility], [facility], [facility] ONLY) - Abnormal; Notable for the following:    I-stat hCG, quantitative >2,000.0 (*)    All other components within normal limits  LIPASE, BLOOD  RPR  HIV ANTIBODY (ROUTINE TESTING)  GC/CHLAMYDIA PROBE AMP ([place]) NOT AT [facility]    Radiology US Ob Comp < 14 Wks  Result Date: 09/14/2016 CLINICAL DATA:  Pregnant patient in first-trimester pregnancy with vomiting and abdominal pain. EXAM: OBSTETRIC <14 WK [place] AND TRANSVAGINAL OB US TECHNIQUE: Both transabdominal and transvaginal  ultrasound examinations were performed for complete evaluation of the gestation as well as the maternal uterus, adnexal regions, and pelvic cul-de-sac. Transvaginal technique was performed to assess early pregnancy. COMPARISON:  None. FINDINGS: Intrauterine gestational sac: Single Yolk sac:  Visualized. Embryo:  Visualized. Cardiac Activity: Visualized. Heart Rate: 111  bpm CRL:  5.2  mm   6 w   4 d                  [place] EDC: 05/06/2017 Subchorionic hemorrhage:  None visualized. Maternal uterus/adnexae: Both ovaries are identified transabdominally and are normal with blood flow. No pelvic free fluid. IMPRESSION: Single live intrauterine pregnancy estimated gestational age [redacted] weeks 4 days for estimated date of delivery 05/06/2017. No subchorionic hemorrhage. Electronically Signed   By: Rubye Oaks M.D.   On: 09/14/2016 02:34   US Ob Transvaginal  Result Date: 09/14/2016 CLINICAL DATA:  Pregnant patient in first-trimester pregnancy with vomiting and abdominal pain. EXAM: OBSTETRIC <14 WK [place] AND TRANSVAGINAL OB US TECHNIQUE: Both transabdominal and transvaginal ultrasound examinations were performed for complete evaluation of the gestation as well as the maternal uterus, adnexal regions, and pelvic cul-de-sac. Transvaginal technique was performed to assess early pregnancy. COMPARISON:  None. FINDINGS: Intrauterine gestational sac: Single Yolk sac:  Visualized. Embryo:  Visualized. Cardiac Activity: Visualized. Heart Rate: 111  bpm CRL:  5.2  mm   6 w   4 d                  [place] EDC: 05/06/2017 Subchorionic hemorrhage:  None visualized. Maternal uterus/adnexae: Both ovaries are identified transabdominally and are normal with blood flow. No pelvic free fluid. IMPRESSION: Single live intrauterine pregnancy estimated gestational age [redacted] weeks 4 days for estimated date of delivery 05/06/2017. No subchorionic hemorrhage. Electronically Signed   By: Rubye Oaks M.D.   On: 09/14/2016 02:34    Procedures Procedures  (including critical care time)  Medications Ordered in ED Medications  ondansetron (ZOFRAN-ODT) disintegrating tablet 4 mg (4 mg Oral Given 09/14/16 0127)  sodium chloride 0.9 % bolus 1,000 mL (0 mLs Intravenous Stopped 09/14/16 0341)  potassium chloride SA (K-DUR,KLOR-CON) CR tablet 40 mEq (40 mEq Oral Given 09/14/16 0128)     Initial Impression / Assessment and Plan / ED Course  I have reviewed the triage vital signs and the nursing notes.  Pertinent labs &  imaging results that were available during my care of the patient were reviewed by me and considered in my medical decision making (see chart for details).     Patient presents with complaints of nausea and intermittent vomiting. She reports that this began after large volume of alcohol intake on Saturday. Patient reports regular and unprotected sexual intercourse. Lab work reveals pregnancy today. Mild hypokalemia with repletion in the department. No evidence of urinary tract infection. Pelvic exam without blood, cervical motion tenderness or adnexal tenderness.  Ultrasound with IUP at approximately 6 weeks. Patient has tolerated fluids greater than 6 ounces without recurrent emesis. Patient will be prescribed diclegis.  She is to f/u with OB/GYN this week.  Discussed ways to decrease nausea and early pregnancy. Patient states understanding and is in agreement with the plan. Abdomen remained soft and nontender.  Final Clinical Impressions(s) / ED Diagnoses   Final diagnoses:  Less than [redacted] weeks gestation of pregnancy  Nausea    New Prescriptions Discharge Medication List as of 09/14/2016  3:21 AM    START taking these medications   Details  Doxylamine-Pyridoxine 10-10 MG TBEC Take 2 tabs at bedtime. If symptoms are controlled, continue taking 2 tabs at bedtime. If symptoms persist, take 2 tabs at bedtime, then 1 tab in the morning of Day 3 and 2 tabs at bedtime. If symptoms are controlled on Day 4, continue as scheduled. If s  ymptoms are not controlled, increase dose to 1 tab in the morning, 1 tab midafternoon, and 2 tabs in the evening. Take as scheduled and not on an as needed basis. Max: 4 tabs daily., Print         Emaly Boschert, Boyd Kerbs 09/14/16 1610    Shon Baton, MD 09/14/16 [phone]

## 2016-09-16 ENCOUNTER — Encounter (HOSPITAL_COMMUNITY): Payer: Self-pay | Admitting: Emergency Medicine

## 2016-09-16 ENCOUNTER — Emergency Department (HOSPITAL_COMMUNITY)
Admission: EM | Admit: 2016-09-16 | Discharge: 2016-09-16 | Disposition: A | Discharge status: Home / Self Care | Payer: Medicaid Other | Attending: Emergency Medicine | Admitting: Emergency Medicine

## 2016-09-16 DIAGNOSIS — A64 Unspecified sexually transmitted disease: Secondary | ICD-10-CM

## 2016-09-16 DIAGNOSIS — J45909 Unspecified asthma, uncomplicated: Secondary | ICD-10-CM | POA: Diagnosis not present

## 2016-09-16 DIAGNOSIS — Z791 Long term (current) use of non-steroidal anti-inflammatories (NSAID): Secondary | ICD-10-CM | POA: Insufficient documentation

## 2016-09-16 DIAGNOSIS — O98211 Gonorrhea complicating pregnancy, first trimester: Secondary | ICD-10-CM | POA: Insufficient documentation

## 2016-09-16 DIAGNOSIS — Z79899 Other long term (current) drug therapy: Secondary | ICD-10-CM | POA: Diagnosis not present

## 2016-09-16 DIAGNOSIS — R109 Unspecified abdominal pain: Secondary | ICD-10-CM

## 2016-09-16 DIAGNOSIS — O26891 Other specified pregnancy related conditions, first trimester: Secondary | ICD-10-CM | POA: Diagnosis not present

## 2016-09-16 DIAGNOSIS — Z202 Contact with and (suspected) exposure to infections with a predominantly sexual mode of transmission: Secondary | ICD-10-CM | POA: Diagnosis not present

## 2016-09-16 DIAGNOSIS — R1030 Lower abdominal pain, unspecified: Secondary | ICD-10-CM | POA: Diagnosis present

## 2016-09-16 DIAGNOSIS — Z3A01 Less than 8 weeks gestation of pregnancy: Secondary | ICD-10-CM | POA: Insufficient documentation

## 2016-09-16 LAB — CBC
HEMATOCRIT: 33.8 % — AB (ref 36.0–46.0)
HEMOGLOBIN: 11.1 g/dL — AB (ref 12.0–15.0)
MCH: 27.1 pg (ref 26.0–34.0)
MCHC: 32.8 g/dL (ref 30.0–36.0)
MCV: 82.4 fL (ref 78.0–100.0)
Platelets: 384 10*3/uL (ref 150–400)
RBC: 4.1 MIL/uL (ref 3.87–5.11)
RDW: 13.9 % (ref 11.5–15.5)
WBC: 5.8 10*3/uL (ref 4.0–10.5)

## 2016-09-16 LAB — COMPREHENSIVE METABOLIC PANEL
ALBUMIN: 3.3 g/dL — AB (ref 3.5–5.0)
ALK PHOS: 46 U/L (ref 38–126)
ALT: 21 U/L (ref 14–54)
ANION GAP: 8 (ref 5–15)
AST: 22 U/L (ref 15–41)
BILIRUBIN TOTAL: 0.5 mg/dL (ref 0.3–1.2)
BUN: 9 mg/dL (ref 6–20)
CALCIUM: 9.1 mg/dL (ref 8.9–10.3)
CO2: 25 mmol/L (ref 22–32)
Chloride: 102 mmol/L (ref 101–111)
Creatinine, Ser: 0.73 mg/dL (ref 0.44–1.00)
GFR calc non Af Amer: >60 mL/min (ref >60)
GLUCOSE: 108 mg/dL — AB (ref 65–99)
POTASSIUM: 3.3 mmol/L — AB (ref 3.5–5.1)
SODIUM: 135 mmol/L (ref 135–145)
TOTAL PROTEIN: 6.9 g/dL (ref 6.5–8.1)

## 2016-09-16 LAB — URINALYSIS, ROUTINE W REFLEX MICROSCOPIC
BACTERIA UA: NONE SEEN
BILIRUBIN URINE: NEGATIVE
GLUCOSE, UA: NEGATIVE mg/dL
KETONES UR: NEGATIVE mg/dL
LEUKOCYTES UA: NEGATIVE
NITRITE: NEGATIVE
PROTEIN: 30 mg/dL — AB
Specific Gravity, Urine: 1.036 — ABNORMAL HIGH (ref 1.005–1.030)
pH: 5 (ref 5.0–8.0)

## 2016-09-16 LAB — I-STAT BETA HCG BLOOD, ED (MC, WL, AP ONLY)

## 2016-09-16 LAB — LIPASE, BLOOD: Lipase: 25 U/L (ref 11–51)

## 2016-09-16 MED ORDER — ONDANSETRON 4 MG PO TBDP
4.0000 mg | ORAL_TABLET | Freq: Once | ORAL | Status: AC | PRN
  Administered 2016-09-16: 4 mg via ORAL

## 2016-09-16 MED ORDER — ACETAMINOPHEN 325 MG PO TABS: 650.0000 mg | ORAL_TABLET | Freq: Four times a day (QID) | ORAL | 0 refills | Status: DC | PRN

## 2016-09-16 MED ORDER — DOXYCYCLINE HYCLATE 100 MG PO TABS
100.0000 mg | ORAL_TABLET | Freq: Once | ORAL | Status: AC
  Administered 2016-09-16: 100 mg via ORAL
  Filled 2016-09-16: qty 1

## 2016-09-16 MED ORDER — DOXYCYCLINE HYCLATE 100 MG PO CAPS: 100.0000 mg | ORAL_CAPSULE | Freq: Two times a day (BID) | ORAL | 0 refills | Status: DC

## 2016-09-16 MED ORDER — DOCUSATE SODIUM 100 MG PO CAPS: 100.0000 mg | ORAL_CAPSULE | Freq: Two times a day (BID) | ORAL | 0 refills | Status: DC

## 2016-09-16 MED ORDER — CEFTRIAXONE SODIUM 250 MG IJ SOLR
250.0000 mg | Freq: Once | INTRAMUSCULAR | Status: AC
  Administered 2016-09-16: 250 mg via INTRAMUSCULAR
  Filled 2016-09-16: qty 250

## 2016-09-16 MED ORDER — ACETAMINOPHEN 325 MG PO TABS
650.0000 mg | ORAL_TABLET | Freq: Once | ORAL | Status: AC
  Administered 2016-09-16: 650 mg via ORAL
  Filled 2016-09-16: qty 2

## 2016-09-16 MED ORDER — LIDOCAINE HCL (PF) 1 % IJ SOLN
INTRAMUSCULAR | Status: AC
  Administered 2016-09-16: 0.9 mL
  Filled 2016-09-16: qty 5

## 2016-09-16 MED ORDER — ONDANSETRON 4 MG PO TBDP: ORAL_TABLET | ORAL | 0 refills | Status: DC

## 2016-09-16 MED ORDER — METRONIDAZOLE 500 MG PO TABS
500.0000 mg | ORAL_TABLET | Freq: Once | ORAL | Status: AC
  Administered 2016-09-16: 500 mg via ORAL
  Filled 2016-09-16: qty 1

## 2016-09-16 MED ORDER — METRONIDAZOLE 500 MG PO TABS: 500.0000 mg | ORAL_TABLET | Freq: Two times a day (BID) | ORAL | 0 refills | Status: DC

## 2016-09-16 MED ORDER — ONDANSETRON 4 MG PO TBDP
ORAL_TABLET | ORAL | Status: AC
  Filled 2016-09-16: qty 1

## 2016-09-16 NOTE — ED Provider Notes (Signed)
[facility] Provider Note   CSN: [id number] Arrival date & time: 09/16/16  0125     History   Chief Complaint Chief Complaint  Patient presents with  . Abdominal Pain    HPI Mikayla Lopez is a 36 y.o. female.   Abdominal Pain   This is a new problem. The current episode started 2 days ago. The problem occurs constantly. The problem has not changed since onset.The pain is associated with eating. The pain is located in the generalized abdominal region. The quality of the pain is aching. The pain is moderate. Associated symptoms include anorexia and nausea. Nothing aggravates the symptoms. Nothing relieves the symptoms. Past workup includes ultrasound.    Past Medical History:  Diagnosis Date  . Asthma     There are no active problems to display for this patient.   History reviewed. No pertinent surgical history.  OB History    Gravida Para Term Preterm AB Living   4         3   SAB TAB Ectopic Multiple Live Births           3       Home Medications    Prior to Admission medications   Medication Sig Start Date End Date Taking? Authorizing Provider  acetaminophen (TYLENOL) 325 MG tablet Take 2 tablets (650 mg total) by mouth every 6 (six) hours as needed. 09/16/16   Kenyon Eshleman, Barbara Cower, MD  cephALEXin (KEFLEX) 500 MG capsule Take 1 capsule (500 mg total) by mouth 2 (two) times daily. 01/09/15   Antony Madura, PA-C  diphenhydrAMINE (BENADRYL) 25 MG tablet Take 1 tablet (25 mg total) by mouth every 6 (six) hours. 10/23/12   Sunnie Nielsen, MD  docusate sodium (COLACE) 100 MG capsule Take 1 capsule (100 mg total) by mouth every 12 (twelve) hours. 09/16/16   Francisca Langenderfer, Barbara Cower, MD  doxycycline (VIBRAMYCIN) 100 MG capsule Take 1 capsule (100 mg total) by mouth 2 (two) times daily. One po bid x 7 days 09/16/16   Marvelle Caudill, Barbara Cower, MD  Doxylamine-Pyridoxine 10-10 MG TBEC Take 2 tabs at bedtime. If symptoms are controlled, continue taking 2 tabs at bedtime. If symptoms persist, take 2 tabs at  bedtime, then 1 tab in the morning of Day 3 and 2 tabs at bedtime. If symptoms are controlled on Day 4, continue as scheduled. If symptoms are not controlled, increase dose to 1 tab in the morning, 1 tab midafternoon, and 2 tabs in the evening. Take as scheduled and not on an as needed basis. Max: 4 tabs daily. 09/14/16   Muthersbaugh, Dahlia Client, PA-C  ibuprofen (ADVIL,MOTRIN) 600 MG tablet Take 1 tablet (600 mg total) by mouth every 6 (six) hours as needed. 01/09/15   Antony Madura, PA-C  metroNIDAZOLE (FLAGYL) 500 MG tablet Take 1 tablet (500 mg total) by mouth 2 (two) times daily. One po bid x 7 days 09/16/16   Randie Tallarico, Barbara Cower, MD  Norethindrone Acetate-Ethinyl Estrad-FE (LOESTRIN 24 FE) 1-20 MG-MCG(24) tablet Take 1 tablet by mouth daily. 07/29/13   Allie Bossier, MD  ondansetron (ZOFRAN ODT) 4 MG disintegrating tablet 4mg  ODT q4 hours prn nausea/vomit 09/16/16   Breindy Meadow, Barbara Cower, MD  permethrin (ELIMITE) 5 % cream Apply to affected area once 10/23/12   Sunnie Nielsen, MD  predniSONE (DELTASONE) 20 MG tablet Take 3 tablets (60 mg total) by mouth daily. 10/23/12   Sunnie Nielsen, MD    Family History No family history on file.  Social History Social History  Substance Use Topics  .  Smoking status: Never Smoker  . Smokeless tobacco: Never Used  . Alcohol use Yes     Comment: Pt states socially     Allergies   Patient has no known allergies.   Review of Systems Review of Systems  Gastrointestinal: Positive for abdominal pain, anorexia and nausea.  All other systems reviewed and are negative.    Physical Exam Updated Vital Signs BP (!) 107/57 (BP Location: Left Arm)   Pulse (!) 58   Temp 98.7 F (37.1 C) (Oral)   Resp 16   Ht 5\' 3"  (1.6 m)   Wt 84.4 kg (186 lb)   LMP 08/12/2016   SpO2 98%   BMI 32.95 kg/m   Physical Exam  Constitutional: She appears well-developed and well-nourished.  HENT:  Head: Normocephalic and atraumatic.  Eyes: Conjunctivae and EOM are normal.  Neck: Normal range  of motion.  Cardiovascular: Normal rate and regular rhythm.   Pulmonary/Chest: Effort normal and breath sounds normal. No stridor. No respiratory distress.  Abdominal: Soft. She exhibits no distension and no mass. There is no tenderness. There is no rebound and no guarding.  Musculoskeletal: Normal range of motion. She exhibits no edema or deformity.  Neurological: She is alert.  Skin: Skin is warm and dry. No erythema. No pallor.  Nursing note and vitals reviewed.    ED Treatments / Results  Labs (all labs ordered are listed, but only abnormal results are displayed) Labs Reviewed  COMPREHENSIVE METABOLIC PANEL - Abnormal; Notable for the following:       Result Value   Potassium 3.3 (*)    Glucose, Bld 108 (*)    Albumin 3.3 (*)    All other components within normal limits  CBC - Abnormal; Notable for the following:    Hemoglobin 11.1 (*)    HCT 33.8 (*)    All other components within normal limits  URINALYSIS, ROUTINE W REFLEX MICROSCOPIC - Abnormal; Notable for the following:    Color, Urine AMBER (*)    Specific Gravity, Urine 1.036 (*)    Hgb urine dipstick SMALL (*)    Protein, ur 30 (*)    Squamous Epithelial / LPF 0-5 (*)    All other components within normal limits  I-STAT BETA HCG BLOOD, ED ([facility], [facility], [facility] ONLY) - Abnormal; Notable for the following:    I-stat hCG, quantitative >2,000.0 (*)    All other components within normal limits  LIPASE, BLOOD    EKG  EKG Interpretation None       Radiology No results found.  Procedures Procedures (including critical care time)  Medications Ordered in ED Medications  ondansetron (ZOFRAN-ODT) disintegrating tablet 4 mg (4 mg Oral Given 09/16/16 0150)  doxycycline (VIBRA-TABS) tablet 100 mg (100 mg Oral Given 09/16/16 0854)  cefTRIAXone (ROCEPHIN) injection 250 mg (250 mg Intramuscular Given 09/16/16 0913)  metroNIDAZOLE (FLAGYL) tablet 500 mg (500 mg Oral Given 09/16/16 0854)  lidocaine (PF) (XYLOCAINE) 1 % injection  (0.9 mLs  Given 09/16/16 0913)  acetaminophen (TYLENOL) tablet 650 mg (650 mg Oral Given 09/16/16 0916)     Initial Impression / Assessment and Plan / ED Course  I have reviewed the triage vital signs and the nursing notes.  Pertinent labs & imaging results that were available during my care of the patient were reviewed by me and considered in my medical decision making (see chart for details).     Suspect early PID. IUP on [place] the other day. Tolerating PO. Will start antibiotics and dc  to follow up with pcp, strict return precautions givne.   Final Clinical Impressions(s) / ED Diagnoses   Final diagnoses:  STD (female)  Abdominal pain, unspecified abdominal location    New Prescriptions Discharge Medication List as of 09/16/2016  9:32 AM    START taking these medications   Details  acetaminophen (TYLENOL) 325 MG tablet Take 2 tablets (650 mg total) by mouth every 6 (six) hours as needed., Starting Fri 09/16/2016, Print    docusate sodium (COLACE) 100 MG capsule Take 1 capsule (100 mg total) by mouth every 12 (twelve) hours., Starting Fri 09/16/2016, Print    doxycycline (VIBRAMYCIN) 100 MG capsule Take 1 capsule (100 mg total) by mouth 2 (two) times daily. One po bid x 7 days, Starting Fri 09/16/2016, Print    metroNIDAZOLE (FLAGYL) 500 MG tablet Take 1 tablet (500 mg total) by mouth 2 (two) times daily. One po bid x 7 days, Starting Fri 09/16/2016, Print    ondansetron (ZOFRAN ODT) 4 MG disintegrating tablet 4mg  ODT q4 hours prn nausea/vomit, Print         Nikiah Goin, Barbara Cower, MD 09/16/16 [phone]

## 2016-09-16 NOTE — ED Triage Notes (Addendum)
Pt to ED c/o lower abd pain starting today with nausea. Pt states she was here 2 days ago for STD test, in which she received a call today stating she was positive for gonorrhea. Pt states she was 6 weeks 4 days pregnant 2 days ago. Denies fevers/chills, no diarrhea. Was told to come in for treatment for gonorrhea.

## 2016-09-21 ENCOUNTER — Other Ambulatory Visit: Payer: Self-pay

## 2016-11-09 ENCOUNTER — Emergency Department (HOSPITAL_COMMUNITY)
Admission: EM | Admit: 2016-11-09 | Discharge: 2016-11-09 | Disposition: A | Discharge status: Home / Self Care | Payer: Medicaid Other | Attending: Physician Assistant | Admitting: Physician Assistant

## 2016-11-09 DIAGNOSIS — J45909 Unspecified asthma, uncomplicated: Secondary | ICD-10-CM | POA: Diagnosis not present

## 2016-11-09 DIAGNOSIS — O26892 Other specified pregnancy related conditions, second trimester: Secondary | ICD-10-CM | POA: Insufficient documentation

## 2016-11-09 DIAGNOSIS — Z79899 Other long term (current) drug therapy: Secondary | ICD-10-CM | POA: Diagnosis not present

## 2016-11-09 DIAGNOSIS — K5900 Constipation, unspecified: Secondary | ICD-10-CM | POA: Insufficient documentation

## 2016-11-09 DIAGNOSIS — O99612 Diseases of the digestive system complicating pregnancy, second trimester: Secondary | ICD-10-CM

## 2016-11-09 DIAGNOSIS — B9689 Other specified bacterial agents as the cause of diseases classified elsewhere: Secondary | ICD-10-CM

## 2016-11-09 DIAGNOSIS — Z3A14 14 weeks gestation of pregnancy: Secondary | ICD-10-CM | POA: Insufficient documentation

## 2016-11-09 DIAGNOSIS — N760 Acute vaginitis: Secondary | ICD-10-CM

## 2016-11-09 DIAGNOSIS — M545 Low back pain: Secondary | ICD-10-CM | POA: Diagnosis not present

## 2016-11-09 DIAGNOSIS — R1030 Lower abdominal pain, unspecified: Secondary | ICD-10-CM | POA: Diagnosis present

## 2016-11-09 LAB — URINALYSIS, ROUTINE W REFLEX MICROSCOPIC
BILIRUBIN URINE: NEGATIVE
Glucose, UA: NEGATIVE mg/dL
Ketones, ur: NEGATIVE mg/dL
LEUKOCYTES UA: NEGATIVE
NITRITE: NEGATIVE
PH: 5 (ref 5.0–8.0)
Protein, ur: NEGATIVE mg/dL
SPECIFIC GRAVITY, URINE: 1.019 (ref 1.005–1.030)

## 2016-11-09 LAB — CBC WITH DIFFERENTIAL/PLATELET
BASOS PCT: 0 %
Basophils Absolute: 0 10*3/uL (ref 0.0–0.1)
EOS PCT: 1 %
Eosinophils Absolute: 0.1 10*3/uL (ref 0.0–0.7)
HEMATOCRIT: 34.2 % — AB (ref 36.0–46.0)
HEMOGLOBIN: 11.1 g/dL — AB (ref 12.0–15.0)
LYMPHS ABS: 2 10*3/uL (ref 0.7–4.0)
LYMPHS PCT: 26 %
MCH: 26.8 pg (ref 26.0–34.0)
MCHC: 32.5 g/dL (ref 30.0–36.0)
MCV: 82.6 fL (ref 78.0–100.0)
MONO ABS: 0.3 10*3/uL (ref 0.1–1.0)
Monocytes Relative: 4 %
Neutro Abs: 5.2 10*3/uL (ref 1.7–7.7)
Neutrophils Relative %: 69 %
Platelets: 301 10*3/uL (ref 150–400)
RBC: 4.14 MIL/uL (ref 3.87–5.11)
RDW: 13.9 % (ref 11.5–15.5)
WBC: 7.6 10*3/uL (ref 4.0–10.5)

## 2016-11-09 LAB — I-STAT CHEM 8, ED
BUN: 8 mg/dL (ref 6–20)
CALCIUM ION: 1.2 mmol/L (ref 1.15–1.40)
CHLORIDE: 101 mmol/L (ref 101–111)
Creatinine, Ser: 0.5 mg/dL (ref 0.44–1.00)
Glucose, Bld: 88 mg/dL (ref 65–99)
HCT: 35 % — ABNORMAL LOW (ref 36.0–46.0)
Hemoglobin: 11.9 g/dL — ABNORMAL LOW (ref 12.0–15.0)
Potassium: 3.8 mmol/L (ref 3.5–5.1)
SODIUM: 137 mmol/L (ref 135–145)
TCO2: 24 mmol/L (ref 22–32)

## 2016-11-09 LAB — WET PREP, GENITAL
Sperm: NONE SEEN
TRICH WET PREP: NONE SEEN
Yeast Wet Prep HPF POC: NONE SEEN

## 2016-11-09 LAB — HCG, QUANTITATIVE, PREGNANCY: hCG, Beta Chain, Quant, S: 32315 m[IU]/mL — ABNORMAL HIGH (ref <5)

## 2016-11-09 MED ORDER — METRONIDAZOLE 1 % EX GEL: Freq: Every day | CUTANEOUS | 0 refills | Status: DC

## 2016-11-09 MED ORDER — MAGNESIUM HYDROXIDE 400 MG/5ML PO SUSP: 30.0000 mL | Freq: Every evening | ORAL | 0 refills | Status: DC | PRN

## 2016-11-09 MED ORDER — ONDANSETRON 4 MG PO TBDP
4.0000 mg | ORAL_TABLET | Freq: Once | ORAL | Status: AC
  Administered 2016-11-09: 4 mg via ORAL
  Filled 2016-11-09: qty 1

## 2016-11-09 MED ORDER — MILK AND MOLASSES ENEMA
1.0000 | Freq: Once | RECTAL | Status: AC
  Administered 2016-11-09: 250 mL via RECTAL
  Filled 2016-11-09: qty 250

## 2016-11-09 MED ORDER — MORPHINE SULFATE (PF) 4 MG/ML IV SOLN
6.0000 mg | Freq: Once | INTRAVENOUS | Status: DC
  Filled 2016-11-09: qty 2

## 2016-11-09 NOTE — ED Triage Notes (Signed)
Pt reports she is [redacted] weeks pregnant with c/o of constipation for 1 week with abd cramping. Pt states she has not been able to  Have a BM in 1 week so she took a laxative last night still unable to have BM and now having cramps into lower stomach and back. denies any vaginal bleeding.

## 2016-11-09 NOTE — ED Provider Notes (Signed)
[facility]   CSN: [id number] Arrival date & time: 11/09/16  [id number]     History   Chief Complaint Chief Complaint  Patient presents with  . Abdominal Cramping    HPI Mikayla Lopez is a 36 y.o. female.  HPI Mikayla Lopez is a 36 y.o. female 5527w4d gestation by last US, G4 P3, presents to emergency department complaining of lower abdominal pain and lower back pain. Patient states her symptoms have been going on for several days but became worse yesterday. She states she is having sharp crampy pain in the lower abdomen that radiates to the back. She states she believes that this is caused by constipation. She states her last normal bowel movement was a week ago. She has tried Dulcolax laxative with no relief of her symptoms at home. She also reports difficulty urinating and having to strain to urinate. She denies any vaginal bleeding or fluid discharge. She reports some nausea and vomiting yesterday, none today. She denies any fever or chills. No other complaints.  Past Medical History:  Diagnosis Date  . Asthma     There are no active problems to display for this patient.   No past surgical history on file.  OB History    Gravida Para Term Preterm AB Living   4         3   SAB TAB Ectopic Multiple Live Births           3       Home Medications    Prior to Admission medications   Medication Sig Start Date End Date Taking? Authorizing Provider  acetaminophen (TYLENOL) 325 MG tablet Take 2 tablets (650 mg total) by mouth every 6 (six) hours as needed. 09/16/16   Mesner, Barbara CowerJason, MD  cephALEXin (KEFLEX) 500 MG capsule Take 1 capsule (500 mg total) by mouth 2 (two) times daily. 01/09/15   Antony MaduraHumes, Kelly, PA-C  diphenhydrAMINE (BENADRYL) 25 MG tablet Take 1 tablet (25 mg total) by mouth every 6 (six) hours. 10/23/12   Sunnie Nielsenpitz, Brian, MD  docusate sodium (COLACE) 100 MG capsule Take 1 capsule (100 mg total) by mouth every 12 (twelve)  hours. 09/16/16   Mesner, Barbara CowerJason, MD  doxycycline (VIBRAMYCIN) 100 MG capsule Take 1 capsule (100 mg total) by mouth 2 (two) times daily. One po bid x 7 days 09/16/16   Mesner, Barbara CowerJason, MD  Doxylamine-Pyridoxine 10-10 MG TBEC Take 2 tabs at bedtime. If symptoms are controlled, continue taking 2 tabs at bedtime. If symptoms persist, take 2 tabs at bedtime, then 1 tab in the morning of Day 3 and 2 tabs at bedtime. If symptoms are controlled on Day 4, continue as scheduled. If symptoms are not controlled, increase dose to 1 tab in the morning, 1 tab midafternoon, and 2 tabs in the evening. Take as scheduled and not on an as needed basis. Max: 4 tabs daily. 09/14/16   Muthersbaugh, Dahlia ClientHannah, PA-C  ibuprofen (ADVIL,MOTRIN) 600 MG tablet Take 1 tablet (600 mg total) by mouth every 6 (six) hours as needed. 01/09/15   Antony MaduraHumes, Kelly, PA-C  metroNIDAZOLE (FLAGYL) 500 MG tablet Take 1 tablet (500 mg total) by mouth 2 (two) times daily. One po bid x 7 days 09/16/16   Mesner, Barbara CowerJason, MD  Norethindrone Acetate-Ethinyl Estrad-FE (LOESTRIN 24 FE) 1-20 MG-MCG(24) tablet Take 1 tablet by mouth daily. 07/29/13   Allie Bossierove, Myra C, MD  ondansetron (ZOFRAN ODT) 4 MG disintegrating tablet 4mg  ODT q4 hours prn nausea/vomit 09/16/16  Mesner, Barbara Cower, MD  permethrin (ELIMITE) 5 % cream Apply to affected area once 10/23/12   Sunnie Nielsen, MD  predniSONE (DELTASONE) 20 MG tablet Take 3 tablets (60 mg total) by mouth daily. 10/23/12   Sunnie Nielsen, MD    Family History No family history on file.  Social History Social History  Substance Use Topics  . Smoking status: Never Smoker  . Smokeless tobacco: Never Used  . Alcohol use Yes     Comment: Pt states socially     Allergies   Patient has no known allergies.   Review of Systems Review of Systems  Constitutional: Negative for chills and fever.  Respiratory: Negative for cough, chest tightness and shortness of breath.   Cardiovascular: Negative for chest pain, palpitations and leg  swelling.  Gastrointestinal: Positive for abdominal pain, constipation, nausea and vomiting. Negative for diarrhea.  Genitourinary: Negative for dysuria, flank pain, pelvic pain, vaginal bleeding, vaginal discharge and vaginal pain.  Musculoskeletal: Positive for back pain. Negative for arthralgias, myalgias, neck pain and neck stiffness.  Skin: Negative for rash.  Neurological: Negative for dizziness, weakness and headaches.  All other systems reviewed and are negative.    Physical Exam Updated Vital Signs BP 107/74 (BP Location: Right Arm)   Pulse 62   Temp 98 F (36.7 C) (Oral)   Resp 16   LMP 08/15/2016 (Approximate)   SpO2 98%   Physical Exam  Constitutional: She appears well-developed and well-nourished. No distress.  HENT:  Head: Normocephalic.  Eyes: Conjunctivae are normal.  Neck: Neck supple.  Cardiovascular: Normal rate, regular rhythm and normal heart sounds.   Pulmonary/Chest: Effort normal and breath sounds normal. No respiratory distress. She has no wheezes. She has no rales.  Abdominal: Soft. Bowel sounds are normal. She exhibits no distension. There is tenderness. There is no rebound.  Diffuse tenderness, worse in lower abdomen  Genitourinary:  Genitourinary Comments: Fecal impaction  Musculoskeletal: She exhibits no edema.  Neurological: She is alert.  Skin: Skin is warm and dry.  Psychiatric: She has a normal mood and affect. Her behavior is normal.  Nursing note and vitals reviewed.    ED Treatments / Results  Labs (all labs ordered are listed, but only abnormal results are displayed) Labs Reviewed  WET PREP, GENITAL - Abnormal; Notable for the following:       Result Value   Clue Cells Wet Prep HPF POC PRESENT (*)    WBC, Wet Prep HPF POC MANY (*)    All other components within normal limits  CBC WITH DIFFERENTIAL/PLATELET - Abnormal; Notable for the following:    Hemoglobin 11.1 (*)    HCT 34.2 (*)    All other components within normal limits    HCG, QUANTITATIVE, PREGNANCY - Abnormal; Notable for the following:    hCG, Beta Chain, Quant, S 32,315 (*)    All other components within normal limits  URINALYSIS, ROUTINE W REFLEX MICROSCOPIC - Abnormal; Notable for the following:    Hgb urine dipstick MODERATE (*)    All other components within normal limits  I-STAT CHEM 8, ED - Abnormal; Notable for the following:    Hemoglobin 11.9 (*)    HCT 35.0 (*)    All other components within normal limits  GC/CHLAMYDIA PROBE AMP ([place]) NOT AT [facility]    EKG  EKG Interpretation None       Radiology No results found.  Procedures Procedures (including critical care time)  Medications Ordered in ED Medications - No data  to display   Initial Impression / Assessment and Plan / ED Course  I have reviewed the triage vital signs and the nursing notes.  Pertinent labs & imaging results that were available during my care of the patient were reviewed by me and considered in my medical decision making (see chart for details).     Pt 14w,4d by last [place] on 8/22, presents to ed with constipation, difficulty urinating, abdominal and back pain. Pt is fecally impacted. Will check fetal heart tones, basic labs, UA, will try enema. Bed side US showd normal fetal heart tones, 140s, good fetal movement.    Initially little relief from enema, but only got half a dose. States "it made my stomach cramp more." will give pain meds and try the rest of the enema.    2:59 PM Pt feels much better after having a large bowel movement. Discussed increasing fluids, increasing fiber. Milk of mag as needed. Follow up with pcp.   Vitals:   11/09/16 0956 11/09/16 1335 11/09/16 1345 11/09/16 1400  BP:  (!) 95/48 (!) 103/52 112/65  Pulse: 61 (!) 57 (!) 54 (!) 56  Resp:      Temp:      TempSrc:      SpO2: 100% 99% 100% 100%     Final Clinical Impressions(s) / ED Diagnoses   Final diagnoses:  Constipation during pregnancy in second trimester     New Prescriptions New Prescriptions   MAGNESIUM HYDROXIDE (MILK OF MAGNESIA) 400 MG/5ML SUSPENSION    Take 30 mLs by mouth at bedtime as needed for mild constipation.     Jaynie Crumble, PA-C 11/09/16 1543    Abelino Derrick, MD 11/14/16 1549

## 2016-11-09 NOTE — ED Notes (Signed)
PA Kirichenko performing bedside ultrasound at this time.

## 2016-11-09 NOTE — Discharge Instructions (Signed)
Increased water intake. Start using fiber supplement. Take milk of magnesia as prescribed.  You do have bacterial vaginosis. Use Metrogel as prescribed. Follow up with OB/GYN

## 2016-11-10 LAB — GC/CHLAMYDIA PROBE AMP (~~LOC~~) NOT AT ARMC
CHLAMYDIA, DNA PROBE: NEGATIVE
NEISSERIA GONORRHEA: NEGATIVE

## 2016-12-08 ENCOUNTER — Other Ambulatory Visit (HOSPITAL_COMMUNITY): Payer: Self-pay | Admitting: Family

## 2016-12-08 ENCOUNTER — Other Ambulatory Visit: Payer: Self-pay | Admitting: Obstetrics & Gynecology

## 2016-12-08 DIAGNOSIS — Z3689 Encounter for other specified antenatal screening: Secondary | ICD-10-CM

## 2016-12-08 DIAGNOSIS — Z3A20 20 weeks gestation of pregnancy: Secondary | ICD-10-CM

## 2016-12-08 DIAGNOSIS — O09522 Supervision of elderly multigravida, second trimester: Secondary | ICD-10-CM

## 2016-12-08 DIAGNOSIS — Z8279 Family history of other congenital malformations, deformations and chromosomal abnormalities: Secondary | ICD-10-CM

## 2016-12-08 LAB — OB RESULTS CONSOLE ABO/RH: RH Type: POSITIVE

## 2016-12-08 LAB — OB RESULTS CONSOLE HIV ANTIBODY (ROUTINE TESTING): HIV: NONREACTIVE

## 2016-12-08 LAB — OB RESULTS CONSOLE RPR: RPR: NONREACTIVE

## 2016-12-08 LAB — OB RESULTS CONSOLE HEPATITIS B SURFACE ANTIGEN: Hepatitis B Surface Ag: NEGATIVE

## 2016-12-08 LAB — OB RESULTS CONSOLE ANTIBODY SCREEN: ANTIBODY SCREEN: NEGATIVE

## 2016-12-08 LAB — OB RESULTS CONSOLE RUBELLA ANTIBODY, IGM: Rubella: IMMUNE

## 2016-12-13 ENCOUNTER — Encounter (HOSPITAL_COMMUNITY): Payer: Self-pay

## 2016-12-13 ENCOUNTER — Encounter (HOSPITAL_COMMUNITY): Payer: Self-pay | Admitting: Obstetrics & Gynecology

## 2016-12-16 ENCOUNTER — Encounter (HOSPITAL_COMMUNITY): Payer: Self-pay | Admitting: *Deleted

## 2016-12-20 ENCOUNTER — Encounter (HOSPITAL_COMMUNITY): Payer: Self-pay

## 2016-12-20 ENCOUNTER — Ambulatory Visit (HOSPITAL_COMMUNITY)
Admission: RE | Admit: 2016-12-20 | Discharge: 2016-12-20 | Disposition: A | Discharge status: Home / Self Care | Payer: Medicaid Other | Source: Ambulatory Visit | Attending: Obstetrics & Gynecology | Admitting: Obstetrics & Gynecology

## 2016-12-20 ENCOUNTER — Other Ambulatory Visit: Payer: Self-pay | Admitting: Obstetrics & Gynecology

## 2016-12-20 ENCOUNTER — Other Ambulatory Visit (HOSPITAL_COMMUNITY): Payer: Self-pay | Admitting: *Deleted

## 2016-12-20 DIAGNOSIS — O99212 Obesity complicating pregnancy, second trimester: Secondary | ICD-10-CM

## 2016-12-20 DIAGNOSIS — Z8279 Family history of other congenital malformations, deformations and chromosomal abnormalities: Secondary | ICD-10-CM

## 2016-12-20 DIAGNOSIS — Z3689 Encounter for other specified antenatal screening: Secondary | ICD-10-CM

## 2016-12-20 DIAGNOSIS — O09522 Supervision of elderly multigravida, second trimester: Secondary | ICD-10-CM

## 2016-12-20 DIAGNOSIS — Z363 Encounter for antenatal screening for malformations: Secondary | ICD-10-CM | POA: Insufficient documentation

## 2016-12-20 DIAGNOSIS — Z369 Encounter for antenatal screening, unspecified: Secondary | ICD-10-CM

## 2016-12-20 DIAGNOSIS — Z3A20 20 weeks gestation of pregnancy: Secondary | ICD-10-CM | POA: Diagnosis not present

## 2016-12-20 DIAGNOSIS — O09529 Supervision of elderly multigravida, unspecified trimester: Secondary | ICD-10-CM

## 2016-12-20 DIAGNOSIS — O352XX1 Maternal care for (suspected) hereditary disease in fetus, fetus 1: Secondary | ICD-10-CM | POA: Insufficient documentation

## 2016-12-20 NOTE — Progress Notes (Signed)
Genetic Counseling  High-Risk Gestation Note  Appointment Date:  12/20/2016 Referred By: Tereso NewcomerAnyanwu, Ugonna A, MD Date of Birth:  [birth year]  Pregnancy History: G4P0003 Estimated Date of Delivery: 05/06/17 Estimated Gestational Age: 3361w3d Attending: Particia Nearingecker, Martha, MD   Mikayla Lopez was seen for genetic counseling because of a maternal age of 36 and family history concerns.  In summary:  Discussed AMA and associated risk for fetal aneuploidy  Reviewed normal Quad screen results and the associated reduction in risks for Down syndrome, trisomy 18, and ONTDs  Discussed other options for screening  NIPS-declined  Ultrasound-performed today; no anomalies or markers seen  Discussed diagnostic testing options  Amniocentesis-declined  Reviewed family history concerns  Patient's maternal half sister stillborn  Reported to have an extra chromosome (??trisomy 5418, no records available to verify reported history)  Patient's mother was 5844 at the time of delivery of this sibling (reviewed age related aneuploidies)  Discussed carrier screening options  CF, SMA, and hemoglobinopathies-declined  She was counseled regarding maternal age and the association with risk for chromosome conditions due to nondisjunction with aging of the ova.  We reviewed chromosomes, nondisjunction, and the associated 1 in 125 risk for fetal aneuploidy related to a maternal age of 89 at 2661w3d weeks gestation.  She was counseled that the risk for aneuploidy decreases as gestational age increases, accounting for those pregnancies which spontaneously abort.  We specifically discussed Down syndrome (trisomy 6921), trisomies 7613 and 7418, and sex chromosome aneuploidies (47,XXX and 47,XXY) including the common features and prognoses of each.   We also reviewed Mikayla Lopez's maternal serum Quad screen result and the associated reduction in risks for fetal Down syndrome (1 in 392 to 1 in 7166), trisomy 18 (1 in 1179 to 1 in  2691), and ONTDs.  She understands that Quad screening provides a pregnancy specific risk for specific fetal aneuploidy, but is not considered to be diagnostic.     We reviewed other available screening options including noninvasive prenatal screening (NIPS)/cell free DNA (cfDNA) screening and detailed ultrasound.  She was counseled that screening tests are used to modify a patient's a priori risk for aneuploidy, typically based on age. This estimate provides a pregnancy specific risk assessment. We reviewed the benefits and limitations of each option. Specifically, we discussed the conditions for which each test screens, the detection rates, and false positive rates of each. She was also counseled regarding diagnostic testing via amniocentesis. We reviewed the approximate 1 in 300-500 risk for complications from amniocentesis, including spontaneous pregnancy loss. We discussed the possible results that the tests might provide including: positive, negative, unanticipated, and no result. Finally, she was counseled regarding the cost of each option and potential out of pocket expenses. After consideration of all the options, she elected to have a detailed ultrasound.  A complete ultrasound was performed today. The ultrasound report will be documented separately. There were no visualized fetal anomalies or markers suggestive of aneuploidy. Amniocentesis and NIPS were declined today. She understands that screening tests cannot rule out all birth defects or genetic syndromes. The patient was advised of this limitation and states she still does not want additional testing at this time.   Mikayla Lopez was provided with written information regarding cystic fibrosis (CF), spinal muscular atrophy (SMA) and hemoglobinopathies including the carrier frequency, availability of carrier screening and prenatal diagnosis if indicated.  In addition, we discussed that CF and hemoglobinopathies are routinely screened for as part of the  [place] newborn screening panel and that SMA  is available as an opt-in. After further discussion, she declined screening for CF, SMA and hemoglobinopathies today.  Both family histories were reviewed and found to be contributory for the patient's mother having a stillbirth at age 35. By report, the patient's mother had genetic testing (possible amniocentesis) a month prior to delivery which revealed the presence of an extra ("47th") chromosome. We reviewed again that the chance of an extra chromosome in a fetus increases as the maternal age increases due to nondisjunction. We discussed that if this child had a trisomy, the risk of recurrence for the current pregnancy is not expected to be increased. If however, the patient's sibling had another chromosome or gene condition, the risk of recurrence depends on the inheritance of the condition. She was encouraged to contact me if she learns more information about this relative or can provide medical records to verify the reported history. There is no other family history of birth defects, intellectual disability, or known genetic conditions. Without further information regarding the provided family history, an accurate genetic risk cannot be calculated. Further genetic counseling is warranted if more information is obtained.  Mikayla Lopez denied exposure to environmental toxins or chemical agents. She denied the use of alcohol, tobacco or street drugs. She denied significant viral illnesses during the course of her pregnancy. Her medical and surgical histories were noncontributory.   I counseled Mikayla Lopez regarding the above risks and available options.  The approximate face-to-face time with the genetic counselor was 38 minutes.  Donald Prosehristy S. Ashlea Dusing, MS Certified Genetic Counselor

## 2016-12-30 ENCOUNTER — Encounter (HOSPITAL_COMMUNITY): Payer: Self-pay

## 2016-12-30 ENCOUNTER — Emergency Department (HOSPITAL_COMMUNITY)
Admission: EM | Admit: 2016-12-30 | Discharge: 2016-12-30 | Disposition: A | Discharge status: Home / Self Care | Payer: Medicaid Other | Attending: Emergency Medicine | Admitting: Emergency Medicine

## 2016-12-30 ENCOUNTER — Other Ambulatory Visit: Payer: Self-pay

## 2016-12-30 DIAGNOSIS — O9989 Other specified diseases and conditions complicating pregnancy, childbirth and the puerperium: Secondary | ICD-10-CM | POA: Diagnosis not present

## 2016-12-30 DIAGNOSIS — R1010 Upper abdominal pain, unspecified: Secondary | ICD-10-CM | POA: Diagnosis not present

## 2016-12-30 DIAGNOSIS — J45909 Unspecified asthma, uncomplicated: Secondary | ICD-10-CM | POA: Insufficient documentation

## 2016-12-30 DIAGNOSIS — M545 Low back pain: Secondary | ICD-10-CM | POA: Diagnosis not present

## 2016-12-30 DIAGNOSIS — Z79899 Other long term (current) drug therapy: Secondary | ICD-10-CM | POA: Insufficient documentation

## 2016-12-30 LAB — CBC WITH DIFFERENTIAL/PLATELET
BASOS ABS: 0 10*3/uL (ref 0.0–0.1)
Basophils Relative: 0 %
EOS PCT: 1 %
Eosinophils Absolute: 0.1 10*3/uL (ref 0.0–0.7)
HCT: 30.4 % — ABNORMAL LOW (ref 36.0–46.0)
Hemoglobin: 9.9 g/dL — ABNORMAL LOW (ref 12.0–15.0)
LYMPHS PCT: 30 %
Lymphs Abs: 2.5 10*3/uL (ref 0.7–4.0)
MCH: 27.3 pg (ref 26.0–34.0)
MCHC: 32.6 g/dL (ref 30.0–36.0)
MCV: 83.7 fL (ref 78.0–100.0)
MONO ABS: 0.4 10*3/uL (ref 0.1–1.0)
Monocytes Relative: 5 %
Neutro Abs: 5.4 10*3/uL (ref 1.7–7.7)
Neutrophils Relative %: 64 %
PLATELETS: 301 10*3/uL (ref 150–400)
RBC: 3.63 MIL/uL — ABNORMAL LOW (ref 3.87–5.11)
RDW: 14.1 % (ref 11.5–15.5)
WBC: 8.4 10*3/uL (ref 4.0–10.5)

## 2016-12-30 LAB — COMPREHENSIVE METABOLIC PANEL
ALBUMIN: 2.9 g/dL — AB (ref 3.5–5.0)
ALK PHOS: 51 U/L (ref 38–126)
ALT: 10 U/L — AB (ref 14–54)
AST: 14 U/L — AB (ref 15–41)
Anion gap: 5 (ref 5–15)
BILIRUBIN TOTAL: 0.3 mg/dL (ref 0.3–1.2)
BUN: 8 mg/dL (ref 6–20)
CO2: 26 mmol/L (ref 22–32)
CREATININE: 0.44 mg/dL (ref 0.44–1.00)
Calcium: 8.6 mg/dL — ABNORMAL LOW (ref 8.9–10.3)
Chloride: 106 mmol/L (ref 101–111)
GFR calc Af Amer: >60 mL/min (ref >60)
GLUCOSE: 86 mg/dL (ref 65–99)
POTASSIUM: 3.6 mmol/L (ref 3.5–5.1)
Sodium: 137 mmol/L (ref 135–145)
TOTAL PROTEIN: 6.4 g/dL — AB (ref 6.5–8.1)

## 2016-12-30 LAB — ABO/RH: ABO/RH(D): A POS

## 2016-12-30 LAB — LIPASE, BLOOD: LIPASE: 23 U/L (ref 11–51)

## 2016-12-30 MED ORDER — SODIUM CHLORIDE 0.9 % IV BOLUS (SEPSIS)
1000.0000 mL | Freq: Once | INTRAVENOUS | Status: AC
  Administered 2016-12-30: 1000 mL via INTRAVENOUS
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
## 2016-12-30 NOTE — Progress Notes (Signed)
[facility]. Patient 1314w6d, G4P3, Patient denies, cramping/contractions, denies LOF or vag bleeding.  Doppler FHR 132, palpated fetal movement.

## 2016-12-30 NOTE — Progress Notes (Signed)
TCT: Dr. Debroah LoopArnold.  Updated on patient status, NO LOF, no contx, no vag bleeding.  Doppler HR 132, fetal movement present. Patient OB cleared. ED Provider in agreeement, plan to dc to home.

## 2016-12-30 NOTE — ED Notes (Signed)
Rapid OB called  
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
## 2016-12-30 NOTE — ED Notes (Signed)
Eula Listenn Kelly to collect labs with iv start

## 2016-12-30 NOTE — ED Notes (Addendum)
Spoke with Consulting civil engineercharge RN at [facility], Chari ManningBecca, who spoke with attending MD. Plan of care per Becca: unnecessary to continuously monitor FHT, as FHT were noted and stable at 138-140 BPM.  Pt is in no distress at this time, denies vaginal bleeding or any other discharge at this time. Will continue to monitor for any changes.

## 2016-12-30 NOTE — ED Notes (Signed)
Bed: FA21WA22 Expected date:  Expected time:  Means of arrival:  Comments: Sanna per rapid OB
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
## 2017-01-24 NOTE — L&D Delivery Note (Signed)
VAGINAL DELIVERY NOTE  37 y.o. [id number] at 2680w0d delivered a viable female infant at 2040 in cephalic, LOA position. No nuchal cord. Left anterior shoulder delivered with ease followed by 60 sec delayed cord clamping. Cord clamped x2 and cut. Placenta delivered spontaneously intact, with 3VC. Fundus firm on exam with massage and pitocin.  Mother: Anesthesia: epidural Laceration: none Suture repair: N/A EBL: 300 mL  Baby: Apgars: 9, 9 Weight: pending Cord pH: not sent  Good hemostasis noted.  Mom to postpartum.  Baby to couplet-care/skin-to-skin.  Durward Parcelavid Brysun Eschmann, DO, PGY-2 05/06/2017, 9:03 PM

## 2017-01-27 ENCOUNTER — Encounter (HOSPITAL_COMMUNITY): Payer: Self-pay

## 2017-01-31 ENCOUNTER — Other Ambulatory Visit (HOSPITAL_COMMUNITY): Payer: Self-pay | Admitting: Maternal and Fetal Medicine

## 2017-01-31 ENCOUNTER — Encounter (HOSPITAL_COMMUNITY): Payer: Self-pay

## 2017-01-31 ENCOUNTER — Ambulatory Visit (HOSPITAL_COMMUNITY)
Admission: RE | Admit: 2017-01-31 | Discharge: 2017-01-31 | Disposition: A | Discharge status: Home / Self Care | Payer: Medicaid Other | Source: Ambulatory Visit | Attending: Obstetrics & Gynecology | Admitting: Obstetrics & Gynecology

## 2017-01-31 ENCOUNTER — Other Ambulatory Visit (HOSPITAL_COMMUNITY): Payer: Self-pay | Admitting: *Deleted

## 2017-01-31 DIAGNOSIS — O09529 Supervision of elderly multigravida, unspecified trimester: Secondary | ICD-10-CM

## 2017-01-31 DIAGNOSIS — Z3A26 26 weeks gestation of pregnancy: Secondary | ICD-10-CM

## 2017-01-31 DIAGNOSIS — Z8279 Family history of other congenital malformations, deformations and chromosomal abnormalities: Secondary | ICD-10-CM

## 2017-03-14 ENCOUNTER — Ambulatory Visit (HOSPITAL_COMMUNITY)
Admission: RE | Admit: 2017-03-14 | Discharge: 2017-03-14 | Disposition: A | Discharge status: Home / Self Care | Payer: Medicaid Other | Source: Ambulatory Visit | Attending: Obstetrics & Gynecology | Admitting: Obstetrics & Gynecology

## 2017-03-14 ENCOUNTER — Encounter (HOSPITAL_COMMUNITY): Payer: Self-pay

## 2017-03-14 DIAGNOSIS — O09523 Supervision of elderly multigravida, third trimester: Secondary | ICD-10-CM | POA: Insufficient documentation

## 2017-03-14 DIAGNOSIS — Z3A32 32 weeks gestation of pregnancy: Secondary | ICD-10-CM | POA: Insufficient documentation

## 2017-03-14 DIAGNOSIS — O09529 Supervision of elderly multigravida, unspecified trimester: Secondary | ICD-10-CM

## 2017-04-03 LAB — OB RESULTS CONSOLE GC/CHLAMYDIA
CHLAMYDIA, DNA PROBE: NEGATIVE
GC PROBE AMP, GENITAL: NEGATIVE

## 2017-04-03 LAB — OB RESULTS CONSOLE GBS: STREP GROUP B AG: NEGATIVE

## 2017-05-05 ENCOUNTER — Inpatient Hospital Stay (HOSPITAL_COMMUNITY): Payer: Medicaid Other | Admitting: Anesthesiology

## 2017-05-05 ENCOUNTER — Inpatient Hospital Stay (HOSPITAL_COMMUNITY)
Admission: AD | Admit: 2017-05-05 | Discharge: 2017-05-08 | DRG: 807 | Disposition: A | Discharge status: Home / Self Care | Payer: Medicaid Other | Source: Ambulatory Visit | Attending: Obstetrics & Gynecology | Admitting: Obstetrics & Gynecology

## 2017-05-05 DIAGNOSIS — O133 Gestational [pregnancy-induced] hypertension without significant proteinuria, third trimester: Secondary | ICD-10-CM

## 2017-05-05 DIAGNOSIS — O9902 Anemia complicating childbirth: Secondary | ICD-10-CM | POA: Diagnosis present

## 2017-05-05 DIAGNOSIS — O134 Gestational [pregnancy-induced] hypertension without significant proteinuria, complicating childbirth: Secondary | ICD-10-CM | POA: Diagnosis present

## 2017-05-05 DIAGNOSIS — Z3A39 39 weeks gestation of pregnancy: Secondary | ICD-10-CM | POA: Diagnosis not present

## 2017-05-05 DIAGNOSIS — D649 Anemia, unspecified: Secondary | ICD-10-CM | POA: Diagnosis present

## 2017-05-05 DIAGNOSIS — O139 Gestational [pregnancy-induced] hypertension without significant proteinuria, unspecified trimester: Secondary | ICD-10-CM

## 2017-05-05 HISTORY — DX: Gestational (pregnancy-induced) hypertension without significant proteinuria, unspecified trimester: O13.9

## 2017-05-05 LAB — PROTEIN / CREATININE RATIO, URINE
CREATININE, URINE: 77 mg/dL
PROTEIN CREATININE RATIO: 0.12 mg/mg{creat} (ref 0.00–0.15)
TOTAL PROTEIN, URINE: 9 mg/dL

## 2017-05-05 LAB — CBC
HCT: 31.3 % — ABNORMAL LOW (ref 36.0–46.0)
HCT: 31.3 % — ABNORMAL LOW (ref 36.0–46.0)
HEMOGLOBIN: 10.4 g/dL — AB (ref 12.0–15.0)
HEMOGLOBIN: 10.2 g/dL — AB (ref 12.0–15.0)
MCH: 27.5 pg (ref 26.0–34.0)
MCH: 27.1 pg (ref 26.0–34.0)
MCHC: 33.2 g/dL (ref 30.0–36.0)
MCHC: 32.6 g/dL (ref 30.0–36.0)
MCV: 82.8 fL (ref 78.0–100.0)
MCV: 83 fL (ref 78.0–100.0)
Platelets: 237 10*3/uL (ref 150–400)
Platelets: 216 10*3/uL (ref 150–400)
RBC: 3.78 MIL/uL — ABNORMAL LOW (ref 3.87–5.11)
RBC: 3.77 MIL/uL — AB (ref 3.87–5.11)
RDW: 15.4 % (ref 11.5–15.5)
RDW: 15.4 % (ref 11.5–15.5)
WBC: 5.9 10*3/uL (ref 4.0–10.5)
WBC: 6.6 10*3/uL (ref 4.0–10.5)

## 2017-05-05 LAB — COMPREHENSIVE METABOLIC PANEL
ALT: 14 U/L (ref 14–54)
ANION GAP: 10 (ref 5–15)
AST: 15 U/L (ref 15–41)
Albumin: 2.7 g/dL — ABNORMAL LOW (ref 3.5–5.0)
Alkaline Phosphatase: 116 U/L (ref 38–126)
BUN: 12 mg/dL (ref 6–20)
CHLORIDE: 107 mmol/L (ref 101–111)
CO2: 21 mmol/L — AB (ref 22–32)
Calcium: 8.6 mg/dL — ABNORMAL LOW (ref 8.9–10.3)
Creatinine, Ser: 0.55 mg/dL (ref 0.44–1.00)
Glucose, Bld: 77 mg/dL (ref 65–99)
Potassium: 4 mmol/L (ref 3.5–5.1)
SODIUM: 138 mmol/L (ref 135–145)
Total Bilirubin: 0.5 mg/dL (ref 0.3–1.2)
Total Protein: 6.1 g/dL — ABNORMAL LOW (ref 6.5–8.1)

## 2017-05-05 LAB — ABO/RH: ABO/RH(D): A POS

## 2017-05-05 LAB — TYPE AND SCREEN
ABO/RH(D): A POS
ANTIBODY SCREEN: NEGATIVE

## 2017-05-05 MED ORDER — OXYTOCIN 40 UNITS IN LACTATED RINGERS INFUSION - SIMPLE MED
2.5000 [IU]/h | INTRAVENOUS | Status: DC
Start: 2017-05-05 — End: 2017-05-06
  Administered 2017-05-06: 2.5 [IU]/h via INTRAVENOUS

## 2017-05-05 MED ORDER — DIPHENHYDRAMINE HCL 50 MG/ML IJ SOLN: 12.5000 mg | INTRAMUSCULAR | Status: DC | PRN

## 2017-05-05 MED ORDER — OXYCODONE-ACETAMINOPHEN 5-325 MG PO TABS
2.0000 | ORAL_TABLET | ORAL | Status: DC | PRN
Start: 2017-05-05 — End: 2017-05-06

## 2017-05-05 MED ORDER — LIDOCAINE HCL (PF) 1 % IJ SOLN
INTRAMUSCULAR | Status: DC | PRN
  Administered 2017-05-05: 4 mL

## 2017-05-05 MED ORDER — EPHEDRINE 5 MG/ML INJ: 10.0000 mg | INTRAVENOUS | Status: DC | PRN

## 2017-05-05 MED ORDER — ONDANSETRON HCL 4 MG/2ML IJ SOLN: 4.0000 mg | Freq: Four times a day (QID) | INTRAMUSCULAR | Status: DC | PRN

## 2017-05-05 MED ORDER — TERBUTALINE SULFATE 1 MG/ML IJ SOLN
0.2500 mg | Freq: Once | INTRAMUSCULAR | Status: DC | PRN
  Filled 2017-05-05: qty 1

## 2017-05-05 MED ORDER — EPHEDRINE 5 MG/ML INJ
10.0000 mg | INTRAVENOUS | Status: DC | PRN
  Filled 2017-05-05: qty 2

## 2017-05-05 MED ORDER — OXYCODONE-ACETAMINOPHEN 5-325 MG PO TABS
1.0000 | ORAL_TABLET | ORAL | Status: DC | PRN
Start: 2017-05-05 — End: 2017-05-06

## 2017-05-05 MED ORDER — FENTANYL 2.5 MCG/ML BUPIVACAINE 1/10 % EPIDURAL INFUSION (WH - ANES)
14.0000 mL/h | INTRAMUSCULAR | Status: DC | PRN
  Administered 2017-05-05 – 2017-05-06 (×5): 14 mL/h via EPIDURAL
  Filled 2017-05-05 (×5): qty 100

## 2017-05-05 MED ORDER — PHENYLEPHRINE 40 MCG/ML (10ML) SYRINGE FOR IV PUSH (FOR BLOOD PRESSURE SUPPORT)
80.0000 ug | PREFILLED_SYRINGE | INTRAVENOUS | Status: DC | PRN
  Administered 2017-05-06 (×2): 80 ug via INTRAVENOUS
  Filled 2017-05-05: qty 5

## 2017-05-05 MED ORDER — TERBUTALINE SULFATE 1 MG/ML IJ SOLN: 0.2500 mg | Freq: Once | INTRAMUSCULAR | Status: DC | PRN

## 2017-05-05 MED ORDER — OXYTOCIN 40 UNITS IN LACTATED RINGERS INFUSION - SIMPLE MED
1.0000 m[IU]/min | INTRAVENOUS | Status: DC
  Administered 2017-05-05: 2 m[IU]/min via INTRAVENOUS
  Filled 2017-05-05: qty 1000

## 2017-05-05 MED ORDER — LACTATED RINGERS IV SOLN
INTRAVENOUS | Status: DC
  Administered 2017-05-06 (×2): via INTRAVENOUS

## 2017-05-05 MED ORDER — SOD CITRATE-CITRIC ACID 500-334 MG/5ML PO SOLN: 30.0000 mL | ORAL | Status: DC | PRN

## 2017-05-05 MED ORDER — MISOPROSTOL 50MCG HALF TABLET
50.0000 ug | ORAL_TABLET | Freq: Once | ORAL | Status: AC
  Administered 2017-05-05: 50 ug via BUCCAL
  Filled 2017-05-05: qty 1

## 2017-05-05 MED ORDER — LACTATED RINGERS IV SOLN
500.0000 mL | INTRAVENOUS | Status: DC | PRN
  Administered 2017-05-06 (×3): 500 mL via INTRAVENOUS

## 2017-05-05 MED ORDER — LIDOCAINE HCL (PF) 1 % IJ SOLN
30.0000 mL | INTRAMUSCULAR | Status: DC | PRN
  Filled 2017-05-05: qty 30

## 2017-05-05 MED ORDER — PHENYLEPHRINE 40 MCG/ML (10ML) SYRINGE FOR IV PUSH (FOR BLOOD PRESSURE SUPPORT)
80.0000 ug | PREFILLED_SYRINGE | INTRAVENOUS | Status: DC | PRN
  Filled 2017-05-05: qty 10
  Filled 2017-05-05: qty 5
  Filled 2017-05-05 (×2): qty 10

## 2017-05-05 MED ORDER — FENTANYL CITRATE (PF) 100 MCG/2ML IJ SOLN
100.0000 ug | INTRAMUSCULAR | Status: DC | PRN
  Administered 2017-05-06: 100 ug via INTRAVENOUS
  Administered 2017-05-06 (×2): 50 ug via INTRAVENOUS
  Administered 2017-05-06 (×2): 100 ug via INTRAVENOUS
  Filled 2017-05-05: qty 2

## 2017-05-05 MED ORDER — ACETAMINOPHEN 325 MG PO TABS: 650.0000 mg | ORAL_TABLET | ORAL | Status: DC | PRN

## 2017-05-05 MED ORDER — LACTATED RINGERS IV SOLN
500.0000 mL | Freq: Once | INTRAVENOUS | Status: AC
  Administered 2017-05-05: 500 mL via INTRAVENOUS

## 2017-05-05 MED ORDER — OXYTOCIN BOLUS FROM INFUSION
500.0000 mL | Freq: Once | INTRAVENOUS | Status: AC
  Administered 2017-05-06: 500 mL via INTRAVENOUS

## 2017-05-05 MED ORDER — PHENYLEPHRINE 40 MCG/ML (10ML) SYRINGE FOR IV PUSH (FOR BLOOD PRESSURE SUPPORT): 80.0000 ug | PREFILLED_SYRINGE | INTRAVENOUS | Status: DC | PRN

## 2017-05-05 MED ORDER — LACTATED RINGERS IV SOLN: 500.0000 mL | Freq: Once | INTRAVENOUS | Status: DC

## 2017-05-05 NOTE — Anesthesia Procedure Notes (Signed)
Epidural Patient location during procedure: OB Start time: 05/05/2017 10:33 PM End time: 05/05/2017 10:39 PM  Staffing Anesthesiologist: Shelton SilvasHollis, Sawyer Mentzer D, MD Performed: anesthesiologist   Preanesthetic Checklist Completed: patient identified, site marked, surgical consent, pre-op evaluation, timeout performed, IV checked, risks and benefits discussed and monitors and equipment checked  Epidural Patient position: sitting Prep: ChloraPrep Patient monitoring: heart rate, continuous pulse ox and blood pressure Approach: midline Location: L3-L4 Injection technique: LOR saline  Needle:  Needle type: Tuohy  Needle gauge: 17 G Needle length: 9 cm Catheter type: closed end flexible Catheter size: 20 Guage Test dose: negative and 1.5% lidocaine  Assessment Events: blood not aspirated, injection not painful, no injection resistance and no paresthesia  Additional Notes LOR @ 7  Patient identified. Risks/Benefits/Options discussed with patient including but not limited to bleeding, infection, nerve damage, paralysis, failed block, incomplete pain control, headache, blood pressure changes, nausea, vomiting, reactions to medications, itching and postpartum back pain. Confirmed with bedside nurse the patient's most recent platelet count. Confirmed with patient that they are not currently taking any anticoagulation, have any bleeding history or any family history of bleeding disorders. Patient expressed understanding and wished to proceed. All questions were answered. Sterile technique was used throughout the entire procedure. Please see nursing notes for vital signs. Test dose was given through epidural catheter and negative prior to continuing to dose epidural or start infusion. Warning signs of high block given to the patient including shortness of breath, tingling/numbness in hands, complete motor block, or any concerning symptoms with instructions to call for help. Patient was given instructions  on fall risk and not to get out of bed. All questions and concerns addressed with instructions to call with any issues or inadequate analgesia.    Reason for block:procedure for pain

## 2017-05-05 NOTE — Anesthesia Pain Management Evaluation Note (Signed)
  CRNA Pain Management Visit Note  Patient: Mikayla Lopez, 37 y.o., female  "Hello I am a [facility] the [facility]. We have an anesthesia team available at all times to provide care throughout the hospital, including epidural management and anesthesia for C-section. I don't know your plan for the delivery whether it a natural birth, water birth, IV sedation, nitrous supplementation, doula or epidural, but we want to meet your pain goals."   1.Was your pain managed to your expectations on prior hospitalizations?   Yes   2.What is your expectation for pain management during this hospitalization?     Epidural  3.How can we help you reach that goal? epidural  Record the patient's initial score and the patient's pain goal.   Pain: 0  Pain Goal: 5 [facility] wants you to be able to say your pain was always managed very well.  Rica RecordsICKELTON,Filippo Puls 05/05/2017

## 2017-05-05 NOTE — H&P (Addendum)
OBSTETRIC ADMISSION HISTORY AND PHYSICAL  Mikayla Lopez is a 37 y.o. female [phone] with IUP at 5535w6d by early ultrasound presenting for gestational hypertension. She reports +FMs, No LOF, no VB, no blurry vision, headaches or peripheral edema, and RUQ pain.  She plans on breast feeding. She request nexplanon for birth control. [facility]   Dating: By early ultrasound --->  Estimated Date of Delivery: 05/06/17  Sono:    @[redacted]w[redacted]d , CWD, normal anatomy, cephalic presentation   Prenatal History/Complications:  Past Medical History: Past Medical History:  Diagnosis Date  . Asthma     Past Surgical History: Past Surgical History:  Procedure Laterality Date  . ADENOIDECTOMY      Obstetrical History: OB History    Gravida  4   Para  3   Term  3   Preterm      AB      Living  3     SAB      TAB      Ectopic      Multiple      Live Births  3           Social History: Social History   Socioeconomic History  . Marital status: Single    Spouse name: Not on file  . Number of children: Not on file  . Years of education: Not on file  . Highest education level: Not on file  Occupational History  . Not on file  Social Needs  . Financial resource strain: Not on file  . Food insecurity:    Worry: Not on file    Inability: Not on file  . Transportation needs:    Medical: Not on file    Non-medical: Not on file  Tobacco Use  . Smoking status: Never Smoker  . Smokeless tobacco: Never Used  Substance and Sexual Activity  . Alcohol use: Yes    Comment: Pt states socially  . Drug use: No  . Sexual activity: Not on file  Lifestyle  . Physical activity:    Days per week: Not on file    Minutes per session: Not on file  . Stress: Not on file  Relationships  . Social connections:    Talks on phone: Not on file    Gets together: Not on file    Attends religious service: Not on file    Active member of club or organization: Not on  file    Attends meetings of clubs or organizations: Not on file    Relationship status: Not on file  Other Topics Concern  . Not on file  Social History Narrative  . Not on file    Family History: No family history on file.  Allergies: No Known Allergies  Medications Prior to Admission  Medication Sig Dispense Refill Last Dose  . docusate sodium (COLACE) 100 MG capsule Take 1 capsule (100 mg total) by mouth every 12 (twelve) hours. 60 capsule 0 05/04/2017 at Unknown time  . Prenatal Vit-Fe Fumarate-FA (MULTIVITAMIN-PRENATAL) 27-0.8 MG TABS tablet Take 1 tablet by mouth daily at 12 noon.   05/04/2017 at Unknown time  . PROVENTIL HFA 108 (90 Base) MCG/ACT inhaler 2 PUFF EVERY 4 HOURS AS NEEDED INHALATION  1 Not Taking     Review of Systems   All systems reviewed and negative except as stated in HPI  Blood pressure (!) 145/86, pulse (!) 56, height 5\' 3"  (1.6 m), weight 103 kg (227 lb), last menstrual period  07/31/2016. General appearance: alert and cooperative Lungs: clear to auscultation bilaterally Heart: regular rate and rhythm Abdomen: soft, non-tender; bowel sounds normal Extremities: Homans sign is negative, no sign of DVT DTR's normal Presentation: cephalic Fetal monitoringBaseline: 135 bpm, Variability: Good {> 6 bpm), Accelerations: Non-reactive but appropriate for gestational age and Decelerations: Absent Uterine activityNone Dilation: 2 Effacement (%): 50 Station: -3 Exam by:: Kris Hartmann, RN   Prenatal labs: ABO, Rh: --/--/A POS (12/07 2119) Antibody: Negative (11/15 0000) Rubella: Immune (11/15 0000) RPR: Nonreactive (11/15 0000)  HBsAg: Negative (11/15 0000)  HIV: Non-reactive (11/15 0000)  GBS: Negative (03/11 0000)  1 hr Glucola negative, 1 hour was 105 Genetic screening  normla Anatomy US normal  Prenatal Transfer Tool  Maternal Diabetes: No Genetic Screening: Normal Maternal Ultrasounds/Referrals: Normal Fetal Ultrasounds or other Referrals:   None Maternal Substance Abuse:  No Significant Maternal Medications:  None Significant Maternal Lab Results: Lab values include: Group B Strep negative  Results for orders placed or performed during the hospital encounter of 05/05/17 (from the past 24 hour(s))  CBC   Collection Time: 05/05/17  1:33 PM  Result Value Ref Range   WBC 5.9 4.0 - 10.5 K/uL   RBC 3.78 (L) 3.87 - 5.11 MIL/uL   Hemoglobin 10.4 (L) 12.0 - 15.0 g/dL   HCT 16.1 (L) 09.6 - 04.5 %   MCV 82.8 78.0 - 100.0 fL   MCH 27.5 26.0 - 34.0 pg   MCHC 33.2 30.0 - 36.0 g/dL   RDW 40.9 81.1 - 91.4 %   Platelets 237 150 - 400 K/uL  Comprehensive metabolic panel   Collection Time: 05/05/17  1:33 PM  Result Value Ref Range   Sodium 138 135 - 145 mmol/L   Potassium 4.0 3.5 - 5.1 mmol/L   Chloride 107 101 - 111 mmol/L   CO2 21 (L) 22 - 32 mmol/L   Glucose, Bld 77 65 - 99 mg/dL   BUN 12 6 - 20 mg/dL   Creatinine, Ser 7.82 0.44 - 1.00 mg/dL   Calcium 8.6 (L) 8.9 - 10.3 mg/dL   Total Protein 6.1 (L) 6.5 - 8.1 g/dL   Albumin 2.7 (L) 3.5 - 5.0 g/dL   AST 15 15 - 41 U/L   ALT 14 14 - 54 U/L   Alkaline Phosphatase 116 38 - 126 U/L   Total Bilirubin 0.5 0.3 - 1.2 mg/dL   GFR calc non Af Amer >60 >60 mL/min   GFR calc Af Amer >60 >60 mL/min   Anion gap 10 5 - 15    Patient Active Problem List   Diagnosis Date Noted  . Gestational hypertension 05/05/2017  . [redacted] weeks gestation of pregnancy   . Advanced maternal age in multigravida, second trimester   . Hereditary disease in family possibly affecting fetus, fetus 1     Assessment/Plan:  Mikayla Lopez is a 37 y.o. [phone] at [redacted]w[redacted]d here for IOL for gestational hypertension. Between 145 and 149 systolic since admission. Urine sample pending.   #Labor: Cytotec given #Pain: Requests epidural #FWB: Cat 1 #ID:  N/A #MOF: breast #MOC:nexplanon #Circ:  In outpatient clinic  Myrene Buddy, MD  05/05/2017, 3:11 PM   CNM attestation:  I have seen and examined this patient; I  agree with above documentation in the resident's note.   Mikayla Lopez is a 37 y.o. [phone] here for IOL for gHTN discovered at her visit at the [facility] today. She denies s/s pre-e and has not has severe range BPs. She  has had SVD x 3.  PE: BP (!) 149/79   Pulse (!) 59   Ht 5\' 3"  (1.6 m)   Wt 103 kg (227 lb)   LMP 07/31/2016   BMI 40.21 kg/m  Gen: calm comfortable, NAD Resp: normal effort, no distress Abd: gravid  ROS, labs, PMH reviewed  Plan: Admit to [employer] CBC/CMET/urine P/C ratio Plan cx ripening with one dose cytotec followed by Pit in 4h Anticipate SVD  SHAW, KIMBERLY CNM 05/05/2017, 4:55 PM

## 2017-05-05 NOTE — Anesthesia Preprocedure Evaluation (Addendum)
Anesthesia Evaluation  Patient identified by MRN, date of birth, ID band Patient awake    Reviewed: Allergy & Precautions, H&P , NPO status , Patient's Chart, lab work & pertinent test results, reviewed documented beta blocker date and time   Airway Mallampati: II  TM Distance: >3 FB Neck ROM: full    Dental no notable dental hx.    Pulmonary neg pulmonary ROS, asthma ,    Pulmonary exam normal breath sounds clear to auscultation       Cardiovascular Exercise Tolerance: Good hypertension, negative cardio ROS   Rhythm:regular Rate:Normal     Neuro/Psych negative neurological ROS  negative psych ROS   GI/Hepatic negative GI ROS, Neg liver ROS,   Endo/Other  negative endocrine ROS  Renal/GU negative Renal ROS  negative genitourinary   Musculoskeletal   Abdominal   Peds  Hematology negative hematology ROS (+) anemia ,   Anesthesia Other Findings   Reproductive/Obstetrics negative OB ROS (+) Pregnancy                            Anesthesia Physical Anesthesia Plan  ASA: II  Anesthesia Plan: Epidural   Post-op Pain Management:    Induction:   PONV Risk Score and Plan:   Airway Management Planned:   Additional Equipment:   Intra-op Plan:   Post-operative Plan:   Informed Consent: I have reviewed the patients History and Physical, chart, labs and discussed the procedure including the risks, benefits and alternatives for the proposed anesthesia with the patient or authorized representative who has indicated his/her understanding and acceptance.   Dental Advisory Given  Plan Discussed with: CRNA  Anesthesia Plan Comments: (Lab Results      Component                Value               Date                      WBC                      6.6                 05/05/2017                HGB                      10.2 (L)            05/05/2017                HCT                      31.3  (L)            05/05/2017                MCV                      83.0                05/05/2017                PLT                      216  05/05/2017           )       Anesthesia Quick Evaluation

## 2017-05-05 NOTE — Progress Notes (Signed)
LABOR PROGRESS NOTE  Subjective:  Patient seen and examined for progress of labor. Patient comfortable with epidural.   Objective:  Vitals:   05/05/17 2245 05/05/17 2250 05/05/17 2255 05/05/17 2300  BP: 140/78 (!) 143/86 139/72 131/80  Pulse: 68 60 62 61  Resp: 16 15 14 15   Temp:      TempSrc:      Weight:      Height:       Dilation: 2.5 Effacement (%): 70 Cervical Position: Middle(pt. right) Station: -3 Presentation: Vertex Exam by:: Fabio NeighborsErin MacDonald RN  FHT: 150 bpm, minimal variability, accelerations absent, absent decelerations TOCO: irregular, every 3-5 minutes  Assessment/Plan: Mikayla Lopez is a 37 y.o. [id number] here for IOL for gHTN discovered [facility]. She denies s/s pre-e and has not has severe range BPs. She has had SVD x 3.  Labor: stage 1 Preeclampsia: negative labs, BP 131/80 Fetal wellbeing: category 1 Pain control: epidural I/D: negative Anticipated MOD: continue expectant management, anticipate SVD  Durward Parcelavid McMullen, DO, PGY-2 05/05/2017, 11:59 PM

## 2017-05-06 ENCOUNTER — Encounter (HOSPITAL_COMMUNITY): Payer: Self-pay | Admitting: General Practice

## 2017-05-06 ENCOUNTER — Inpatient Hospital Stay (HOSPITAL_COMMUNITY): Payer: Medicaid Other | Admitting: Anesthesiology

## 2017-05-06 DIAGNOSIS — O134 Gestational [pregnancy-induced] hypertension without significant proteinuria, complicating childbirth: Secondary | ICD-10-CM

## 2017-05-06 DIAGNOSIS — Z3A39 39 weeks gestation of pregnancy: Secondary | ICD-10-CM

## 2017-05-06 LAB — CBC
HEMATOCRIT: 32.3 % — AB (ref 36.0–46.0)
HEMOGLOBIN: 10.8 g/dL — AB (ref 12.0–15.0)
MCH: 27.6 pg (ref 26.0–34.0)
MCHC: 33.4 g/dL (ref 30.0–36.0)
MCV: 82.6 fL (ref 78.0–100.0)
Platelets: 181 10*3/uL (ref 150–400)
RBC: 3.91 MIL/uL (ref 3.87–5.11)
RDW: 15.7 % — ABNORMAL HIGH (ref 11.5–15.5)
WBC: 15.6 10*3/uL — ABNORMAL HIGH (ref 4.0–10.5)

## 2017-05-06 LAB — RPR: RPR: NONREACTIVE

## 2017-05-06 MED ORDER — ONDANSETRON HCL 4 MG/2ML IJ SOLN: 4.0000 mg | INTRAMUSCULAR | Status: DC | PRN

## 2017-05-06 MED ORDER — IBUPROFEN 600 MG PO TABS
600.0000 mg | ORAL_TABLET | Freq: Four times a day (QID) | ORAL | Status: DC
Start: 2017-05-07 — End: 2017-05-08
  Administered 2017-05-07 – 2017-05-08 (×6): 600 mg via ORAL
  Filled 2017-05-06 (×7): qty 1

## 2017-05-06 MED ORDER — ACETAMINOPHEN 325 MG PO TABS
650.0000 mg | ORAL_TABLET | ORAL | Status: DC | PRN
Start: 2017-05-06 — End: 2017-05-08
  Administered 2017-05-07 – 2017-05-08 (×7): 650 mg via ORAL
  Filled 2017-05-06 (×7): qty 2

## 2017-05-06 MED ORDER — DIBUCAINE 1 % RE OINT
1.0000 "application " | TOPICAL_OINTMENT | RECTAL | Status: DC | PRN
  Administered 2017-05-07: 1 via RECTAL
  Filled 2017-05-06: qty 28

## 2017-05-06 MED ORDER — FENTANYL CITRATE (PF) 100 MCG/2ML IJ SOLN
INTRAMUSCULAR | Status: AC
  Filled 2017-05-06: qty 2

## 2017-05-06 MED ORDER — TETANUS-DIPHTH-ACELL PERTUSSIS 5-2.5-18.5 LF-MCG/0.5 IM SUSP: 0.5000 mL | Freq: Once | INTRAMUSCULAR | Status: DC

## 2017-05-06 MED ORDER — PRENATAL MULTIVITAMIN CH
1.0000 | ORAL_TABLET | Freq: Every day | ORAL | Status: DC
  Administered 2017-05-07 – 2017-05-08 (×2): 1 via ORAL
  Filled 2017-05-06 (×2): qty 1

## 2017-05-06 MED ORDER — SENNOSIDES-DOCUSATE SODIUM 8.6-50 MG PO TABS
2.0000 | ORAL_TABLET | ORAL | Status: DC
Start: 2017-05-07 — End: 2017-05-08
  Administered 2017-05-07 (×2): 2 via ORAL
  Filled 2017-05-06 (×2): qty 2

## 2017-05-06 MED ORDER — BUPIVACAINE HCL (PF) 0.25 % IJ SOLN
INTRAMUSCULAR | Status: DC | PRN
  Administered 2017-05-06 (×3): 5 mL via EPIDURAL

## 2017-05-06 MED ORDER — DIPHENHYDRAMINE HCL 25 MG PO CAPS: 25.0000 mg | ORAL_CAPSULE | Freq: Four times a day (QID) | ORAL | Status: DC | PRN

## 2017-05-06 MED ORDER — ONDANSETRON HCL 4 MG PO TABS: 4.0000 mg | ORAL_TABLET | ORAL | Status: DC | PRN

## 2017-05-06 MED ORDER — SODIUM BICARBONATE 8.4 % IV SOLN
INTRAVENOUS | Status: DC | PRN
  Administered 2017-05-06: 5 mL via EPIDURAL

## 2017-05-06 MED ORDER — COCONUT OIL OIL: 1.0000 "application " | TOPICAL_OIL | Status: DC | PRN

## 2017-05-06 MED ORDER — WITCH HAZEL-GLYCERIN EX PADS
1.0000 "application " | MEDICATED_PAD | CUTANEOUS | Status: DC | PRN
  Administered 2017-05-07: 1 via TOPICAL

## 2017-05-06 MED ORDER — OXYTOCIN 40 UNITS IN LACTATED RINGERS INFUSION - SIMPLE MED: 1.0000 m[IU]/min | INTRAVENOUS | Status: DC

## 2017-05-06 MED ORDER — BENZOCAINE-MENTHOL 20-0.5 % EX AERO
1.0000 "application " | INHALATION_SPRAY | CUTANEOUS | Status: DC | PRN
  Administered 2017-05-07: 1 via TOPICAL
  Filled 2017-05-06: qty 56

## 2017-05-06 MED ORDER — SIMETHICONE 80 MG PO CHEW: 80.0000 mg | CHEWABLE_TABLET | ORAL | Status: DC | PRN

## 2017-05-06 MED ORDER — ZOLPIDEM TARTRATE 5 MG PO TABS
5.0000 mg | ORAL_TABLET | Freq: Every evening | ORAL | Status: DC | PRN
Start: 2017-05-06 — End: 2017-05-08

## 2017-05-06 NOTE — Progress Notes (Signed)
Subjective: Doing well with no complaints. Little progress made since last check. Pressures in 110s systolic.   Objective: BP 114/63   Pulse 64   Temp 98.4 F (36.9 C) (Axillary)   Resp 18   Ht 5\' 3"  (1.6 m)   Wt 103 kg (227 lb)   LMP 07/31/2016   BMI 40.21 kg/m  No intake/output data recorded. Total I/O In: -  Out: 325 [Urine:325]  FHT:  FHR: 110 bpm, variability: moderate,  accelerations:  Abcent,  decelerations:  Present recurrent late decels UC:   regular, every 6-7 minutes SVE:   Dilation: 8.5 Effacement (%): 90 Station: -1 Exam by:: HHogan,cnm  Labs: Lab Results  Component Value Date   WBC 6.6 05/05/2017   HGB 10.2 (L) 05/05/2017   HCT 31.3 (L) 05/05/2017   MCV 83.0 05/05/2017   PLT 216 05/05/2017    Assessment / Plan: Induction of labor due to gestational hypertension,  progressing well on pitocin  Labor: non-reassuring tones previously on pit. Giving dose of ephedrine and will restart pit at 2. Preeclampsia:  N/a, pressures well controlled. Fetal Wellbeing:  Category II Pain Control:  Epidural I/D:  n/a Anticipated MOD:  NSVD  Myrene [name] Tena Linebaugh 05/06/2017, 10:22 AM

## 2017-05-06 NOTE — Anesthesia Postprocedure Evaluation (Signed)
Anesthesia Post Note  Patient: Mikayla Lopez  Procedure(s) Performed: AN AD HOC LABOR EPIDURAL     Patient location during evaluation: Mother Baby Anesthesia Type: Epidural Level of consciousness: awake and alert Pain management: pain level controlled Vital Signs Assessment: post-procedure vital signs reviewed and stable Respiratory status: spontaneous breathing, nonlabored ventilation and respiratory function stable Cardiovascular status: stable Postop Assessment: no headache, no backache and epidural receding Anesthetic complications: no    Last Vitals:  Vitals:   05/06/17 1132 05/06/17 1140  BP: (!) 139/95 (!) 113/56  Pulse: 60 63  Resp:    Temp:      Last Pain:  Vitals:   05/06/17 1114  TempSrc:   PainSc: 4    Pain Goal: Patients Stated Pain Goal: 2 (05/06/17 0030)               Reniyah Gootee

## 2017-05-06 NOTE — Addendum Note (Signed)
Addendum  created 05/06/17 1829 by Shelton SilvasHollis, Parilee Hally D, MD   Intraprocedure Event deleted, Intraprocedure Staff edited

## 2017-05-06 NOTE — Progress Notes (Signed)
Patient has had adequate MVUs x 2 hour. No cervical change on re-check. Dr. Debroah LoopArnold notified, and he will come assess the patient.  FHT 130, moderate with 15x15 accels, occ variable and early decel IUPC: adequate MVUs, q2-3 min contractions.  Thressa ShellerHeather Hogan 7:04 PM target organ damage a

## 2017-05-06 NOTE — Progress Notes (Signed)
Subjective: epidural redosed. Pain tolerance continues to be an issue.   Objective: BP 130/61   Pulse 74   Temp 98.4 F (36.9 C) (Axillary)   Resp 18   Ht 5\' 3"  (1.6 m)   Wt 103 kg (227 lb)   LMP 07/31/2016   BMI 40.21 kg/m  No intake/output data recorded. Total I/O In: -  Out: 325 [Urine:325]  FHT:  FHR: 140 bpm, variability: moderate,  accelerations:  Abscent,  decelerations:  Few variable decels noted UC:   regular, every 4 minutes SVE:   Dilation: 8.5 Effacement (%): 90 Station: -1 Exam by:: H.Hogan CNM  Labs: Lab Results  Component Value Date   WBC 6.6 05/05/2017   HGB 10.2 (L) 05/05/2017   HCT 31.3 (L) 05/05/2017   MCV 83.0 05/05/2017   PLT 216 05/05/2017    Assessment / Plan: Induction of labor due to gestational hypertension, little progress since last check  Labor: On pitocin Gestationl hypertension:  pressures well controlled Fetal Wellbeing:  Category II Pain Control:  Epidural I/D:  n/a Anticipated MOD:  NSVD  Mikayla Lopez 05/06/2017, 2:48 PM

## 2017-05-06 NOTE — Progress Notes (Signed)
Mikayla Lopez is a 37 y.o. G4P3003 at 2940w0d  ad[facility] for [facility]  Subjective:  No complaints. Was feeling some pain earlier, and epidural was recently dosed.  Objective: BP 114/63   Pulse 64   Temp 98.4 F (36.9 C) (Axillary)   Resp 18   Ht 5\' 3"  (1.6 m)   Wt 227 lb (103 kg)   LMP 07/31/2016   BMI 40.21 kg/m  No intake/output data recorded. Total I/O In: -  Out: 325 [Urine:325]  FHT:  FHR: 125 bpm, variability: moderate,  accelerations:  Present,  decelerations:  Present variables and late onset decelerations noted. UC:   regular, every 5-7 minutes SVE:   Dilation: 8.5 Effacement (%): 90 Station: -1 Exam by:: HHogan,cnm   IUPC placed without difficulty   Dr. Debroah LoopArnold consulted re: FHT tracing, and protracted active phase of labor. Ok to restart pitocin and continue to monitor.   Labs: Lab Results  Component Value Date   WBC 6.6 05/05/2017   HGB 10.2 (L) 05/05/2017   HCT 31.3 (L) 05/05/2017   MCV 83.0 05/05/2017   PLT 216 05/05/2017    Assessment / Plan: Protracted active phase  Labor: no change over several hours. Pitocn has been off most of the time. IUPC placed to assess contractions.  Preeclampsia:  no severe features  Fetal Wellbeing:  Category II Pain Control:  Epidural I/D:  n/a Anticipated MOD:  guarded for SVD at this time. Will continue to assess.   Thressa ShellerHeather Alandis Bluemel 05/06/2017, 10:45 AM

## 2017-05-06 NOTE — Progress Notes (Signed)
Mikayla MapleShanna Lopez is a 37 y.o. G4P3003 at 8920w0d admitted for [facility] of [facility]  Subjective:  Coping well. Feeling some pain on the right side. Patient strongly desires to not feel any pain.  Objective: BP 130/61   Pulse 74   Temp 98.4 F (36.9 C) (Axillary)   Resp 18   Ht 5\' 3"  (1.6 m)   Wt 227 lb (103 kg)   LMP 07/31/2016   BMI 40.21 kg/m  No intake/output data recorded. Total I/O In: -  Out: 325 [Urine:325]  FHT:  FHR: 120 bpm, variability: moderate,  accelerations:  Present,  decelerations:  Present early, variable  UC:   regular, every 3-5 minutes SVE:   Dilation: 8.5 Effacement (%): 90 Station: -1 Exam by:: H.Mikayla Lopez CNM  Labs: Lab Results  Component Value Date   WBC 6.6 05/05/2017   HGB 10.2 (L) 05/05/2017   HCT 31.3 (L) 05/05/2017   MCV 83.0 05/05/2017   PLT 216 05/05/2017    Assessment / Plan: Protracted active phase  Labor: not adequate MVUs at this time, some progress with descent at this time.  Preeclampsia:  no severe features  Fetal Wellbeing:  Category I Pain Control:  Epidural I/D:  n/a Anticipated MOD:  guarded about NSVD   Dr. Debroah LoopArnold updated on patient's status.   Thressa ShellerHeather Zaniya Lopez 05/06/2017, 2:50 PM

## 2017-05-06 NOTE — Progress Notes (Signed)
LABOR PROGRESS NOTE  Subjective:  Patient seen and examined for progress of labor. Patient comfortable with epidural.   Objective:  Vitals:   05/06/17 0500 05/06/17 0530 05/06/17 0600 05/06/17 0630  BP: 137/67 (!) 139/55 122/64 114/73  Pulse: 67 68 75 67  Resp:  16 15 (P) 14  Temp:      TempSrc:      Weight:      Height:       Dilation: 9 Effacement (%): 90 Cervical Position: Middle Station: -1 Presentation: Vertex Exam by:: Fabio NeighborsErin MacDonald RN  FHT: 150 bpm, moderate variability, accelerations present, absent decelerations TOCO: regular, every 3-4 minutes  Assessment/Plan: Mikayla Lopez is a 37 y.o. [id number] here for IOL for gHTN [facility]. She denies s/s pre-e and has not has severe range BPs. She has had SVD x 3.  Labor: stage 2 Preeclampsia: negative labs Fetal wellbeing: category 1 Pain control:epidural I/D:negative Anticipated MOD: continue expectant management, anticipate SVD  Durward Parcelavid Kylen Ismael, DO, PGY-2 05/06/2017, 6:56 AM

## 2017-05-06 NOTE — Addendum Note (Signed)
Addendum  created 05/06/17 2230 by Shelton SilvasHollis, Akeyla Molden D, MD   Intraprocedure Event edited, Intraprocedure Staff edited

## 2017-05-07 ENCOUNTER — Other Ambulatory Visit: Payer: Self-pay

## 2017-05-07 ENCOUNTER — Encounter (HOSPITAL_COMMUNITY): Payer: Self-pay | Admitting: *Deleted

## 2017-05-07 NOTE — Clinical Social Work Maternal (Deleted)
CLINICAL SOCIAL WORK MATERNAL/CHILD NOTE  Patient Details  Name: Mikayla Lopez MRN: [id number] Date of Birth: 01/18/1981  Date:  2017/03/31  Clinical Social Worker Initiating Note:  Madilyn Fireman, MSW, LCSW-A Date/Time: Initiated:  05/07/17/1454     Child's Name:  Mikayla Lopez   Biological Parents:  Mother, Father   Need for Interpreter:  None   Reason for Referral:  Current Substance Use/Substance Use During Pregnancy    Address:  [place] [address]    Phone number:  [phone] (home)     Additional phone number:   Household Members/Support Persons (HM/SP):   Household Member/Support Person 1   HM/SP Name Relationship DOB or Age  HM/SP -1 [name]      HM/SP -2        HM/SP -3        HM/SP -4        HM/SP -5        HM/SP -6        HM/SP -7        HM/SP -8          Natural Supports (not living in the home):  Friends, Extended Family, Immediate Family   Professional Supports:     Employment: Unemployed   Type of Work:     Education:      Homebound arranged:    Pensions consultant:  Medicaid   Other Resources:  [employer], Physicist, medical    Cultural/Religious Considerations Which May Impact Care:    Strengths:  Ability to meet basic needs    Psychotropic Medications:         Pediatrician:       Pediatrician List:   [facility]      Pediatrician Fax Number:    Risk Factors/Current Problems:      Cognitive State:  Able to Concentrate , Alert    Mood/Affect:  Calm , Comfortable    CSW Assessment: CSW met with patient and infant at bedside to discuss positive UDS results for marijuana. Patient was positive for marijuana on admission and infant was positive for marijuana and opiates. Patient stated she does not smoke marijuana but was frequently around others who do. Patient stated that she was a  surrogate for her cousin, Mikayla Lopez who lives in [place], [place]. Patient reports wanting her cousin to adopt the infant and plans on going there soon after discharge.Patient has not taken any legal action or preparation for adoption, only verbal conversations with her cousin. Patient reports the FOB's name is Mikayla Lopez but she has no contact with him. Patient has a set of one year old twin girls, Mikayla Lopez and Mikayla Lopez (DOB 11/03/15). Patient reports having a 21 year old son, Mikayla Lopez that lives with his biological father in [place], [place]. Patient does have joint custody of her son, shared with his father. Patient reports just moving to [place] from [place] on 03/25/17. Patient resides with her sister and her spouse, with their being four total children in the home. Patient reports having a car seat for infant, safe transportation discussed. Patient repots having a crib for infant, SIDS reduction techniques discussed.  CSW placed call to [facility] CPS to make report. Report was accepted, intake worker stated that a visit will be completed by CPS to [facility] within 24 hours, likely on Monday, 06/30/17.  There are barriers to discharge. Do not discharge infant until CPS has completed assessment.   CSW Plan/Description:  Psychosocial Support and Ongoing Assessment of Needs, Sudden Infant Death Syndrome (SIDS) Education, Perinatal Mood and Anxiety Disorder (PMADs) Education, [place], Child Protective Service Report , CSW Awaiting CPS Disposition Plan, CSW Will Continue to Monitor Umbilical Cord Tissue Drug Screen Results and Make Report if Mikayla Lopez, LCSWA 05/07/2017, 2:56 PM

## 2017-05-07 NOTE — Addendum Note (Signed)
Addendum  created 05/07/17 0659 by Shelton SilvasHollis, Anessa Charley D, MD   Sign clinical note

## 2017-05-07 NOTE — Progress Notes (Signed)
Post Partum Day 1 Subjective: Having soreness mostly with some improvement using Motrin. Having some vaginal bleeding.  Objective: Blood pressure 126/72, pulse (!) 59, temperature 98 F (36.7 C), resp. rate 18, height 5\' 3"  (1.6 m), weight 227 lb (103 kg), last menstrual period 07/31/2016, SpO2 96 %, unknown if currently breastfeeding.  Physical Exam:  General: alert and no distress Lochia: appropriate Uterine Fundus: firm Incision: N/A DVT Evaluation: No evidence of DVT seen on physical exam.  Recent Labs    05/05/17 1942 05/06/17 2136  HGB 10.2* 10.8*  HCT 31.3* 32.3*    Assessment/Plan: Plan for discharge tomorrow and Contraception nexplanon   LOS: 2 days   Wendee BeaversDavid J McMullen 05/07/2017, 7:51 AM

## 2017-05-07 NOTE — Anesthesia Postprocedure Evaluation (Signed)
Anesthesia Post Note  Patient: Mikayla Lopez  Procedure(s) Performed: AN AD HOC LABOR EPIDURAL     Patient location during evaluation: Mother Baby Anesthesia Type: Epidural Level of consciousness: awake and alert Pain management: pain level controlled Vital Signs Assessment: post-procedure vital signs reviewed and stable Respiratory status: spontaneous breathing, nonlabored ventilation and respiratory function stable Cardiovascular status: stable Postop Assessment: no headache, no backache and epidural receding Anesthetic complications: no    Last Vitals:  Vitals:   05/07/17 0004 05/07/17 0427  BP: 136/70 126/72  Pulse: 73 (!) 59  Resp: 18 18  Temp: 37 C 36.7 C  SpO2: 93% 96%    Last Pain:  Vitals:   05/07/17 0515  TempSrc:   PainSc: 6    Pain Goal: Patients Stated Pain Goal: 2 (05/06/17 0030)               Shelton SilvasKevin D Cam Harnden

## 2017-05-07 NOTE — Anesthesia Postprocedure Evaluation (Signed)
Anesthesia Post Note  Patient: Mikayla Lopez  Procedure(s) Performed: AN AD HOC LABOR EPIDURAL     Patient location during evaluation: Mother Baby Anesthesia Type: Epidural Level of consciousness: awake and alert and oriented Pain management: satisfactory to patient Vital Signs Assessment: post-procedure vital signs reviewed and stable Respiratory status: respiratory function stable Cardiovascular status: stable Postop Assessment: no headache, no backache, epidural receding, patient able to bend at knees, no signs of nausea or vomiting and adequate PO intake Anesthetic complications: no    Last Vitals:  Vitals:   05/07/17 0004 05/07/17 0427  BP: 136/70 126/72  Pulse: 73 (!) 59  Resp: 18 18  Temp: 37 C 36.7 C  SpO2: 93% 96%    Last Pain:  Vitals:   05/07/17 1011  TempSrc:   PainSc: 6    Pain Goal: Patients Stated Pain Goal: 2 (05/06/17 0030)               Karleen DolphinFUSSELL,Arianna Haydon

## 2017-05-07 NOTE — Clinical Social Work Maternal (Signed)
CLINICAL SOCIAL WORK MATERNAL/CHILD NOTE  Patient Details  Name: Mikayla Lopez MRN: [id number] Date of Birth: 2016-03-10  Date:  Aug 21, 2017  Clinical Social Worker Initiating Note:  Madilyn Fireman, MSW, LCSW-A Date/Time: Initiated:  05/07/17/1506     Child's Name:  Mikayla Lopez   Biological Parents:  Mother, Father   Need for Interpreter:  None   Reason for Referral:  Current Substance Use/Substance Use During Pregnancy    Address:  [place] [address]    Phone number:  [phone] (home)     Additional phone number:  Household Members/Support Persons (HM/SP):   Household Member/Support Person 1   HM/SP Name Relationship DOB or Age  HM/SP -1 Mikayla Lopez "Step Father" of infant    HM/SP -2        HM/SP -3        HM/SP -4        HM/SP -5        HM/SP -6        HM/SP -7        HM/SP -8          Natural Supports (not living in the home):  Extended Family, Immediate Family   Professional Supports:     Employment: Animator   Type of Work: Therapist, art @ [employer]   Education:  [place] arranged:    Museum/gallery curator Resources:  Medicaid   Other Resources:  Physicist, medical , [place] Considerations Which May Impact Care:    Strengths:  Ability to meet basic needs , Home prepared for child    Psychotropic Medications:   None      Pediatrician:     "Somewhere in [place]"  Pediatrician List:   [place]      Pediatrician Fax Number:    Risk Factors/Current Problems:      Cognitive State:  Alert , Able to Concentrate    Mood/Affect:  Calm , Comfortable    CSW Assessment: CSW met with patient, "step father" of infant Mikayla Lopez, and infant at bedside. CSW obtained permission from patient to have discussion regarding personal matters in front of Mikayla Lopez. Patient stated that the biological father  of the infant is not involved and that Mikayla Lopez is the "step father." CSW inquired about patient's other children, they are 33 and 3 years old. Both older children reside in [place] with their biological father, but patient has visits with them. Patient states that she has good support from her mother. Patient has a new car seat for infant, safe transportation discussed. Patient states that she has a crib for infant, SIDS reduction techniques discussed. Patient has not yet chosen a pediatrician but stated it would be "somewhere in [place]." Patient and CSW discussed positive UDS for cocaine and marijuana back in November 2018. Patient stated she ate "edibles" that made her test positive and has not used any substances since. Patient UDS negative in January and March. CSW educated patient on hospital drug screening policy and mandated reporting of results to DSS. Patient states she gets [employer], Physicist, medical, and Medicaid. Patient is employed full time at [employer]. Patient did not have further questions for CSW, CSW encouraged patient to reach out for assistance if needs or questions arise. A CPS report was not made at this time due to not  having UDS results back from patient or newborn. CSW to continue following.  CSW Plan/Description:  CSW Will Continue to Monitor Umbilical Cord Tissue Drug Screen Results and Make Report if [facility] [place], Perinatal Mood and Anxiety Disorder (PMADs) Education, Sudden Infant Death Syndrome (SIDS) Education, Neonatal Abstinence Syndrome (NAS) Education    Archie Endo, LCSWA 01/04/2018, 3:09 PM

## 2017-05-07 NOTE — Addendum Note (Signed)
Addendum  created 05/07/17 1255 by Graciela HusbandsFussell, Levenia Skalicky O, CRNA   Charge Capture section accepted, Sign clinical note

## 2017-05-08 NOTE — Lactation Note (Signed)
This note was copied from a baby's chart. Lactation Consultation Note  Patient Name: Boy Loraine MapleShanna Laine ZOXWR'UToday's Date: 05/08/2017 Reason for consult: Initial assessment   Baby 42 hours old and was formula feeding but now is breastfeeding due to 2- negative UDS drug screens. Mother has large breasts with evert compressible nipples. Mother breastfed her other children for 6-8 mos. Reviewed hand expression and mother had good flow of colostrum. Baby was latched upon entering with mother holding her breast toward baby. Suggest she allow breasts to drop naturally and then latch baby in football hold. Intermittent swallows observed. Mom encouraged to feed baby 8-12 times/24 hours and with feeding cues.  Mom made aware of O/P services, breastfeeding support groups, community resources, and our phone # for post-discharge questions.     Maternal Data Has patient been taught Hand Expression?: Yes Does the patient have breastfeeding experience prior to this delivery?: Yes  Feeding Feeding Type: Breast Fed  LATCH Score Latch: Repeated attempts needed to sustain latch, nipple held in mouth throughout feeding, stimulation needed to elicit sucking reflex.  Audible Swallowing: Spontaneous and intermittent  Type of Nipple: Everted at rest and after stimulation  Comfort (Breast/Nipple): Soft / non-tender  Hold (Positioning): Assistance needed to correctly position infant at breast and maintain latch.  LATCH Score: 8  Interventions Interventions: Breast feeding basics reviewed;Assisted with latch;Breast compression;Support pillows  Lactation Tools Discussed/Used     Consult Status Consult Status: Follow-up Date: 05/09/17 Follow-up type: In-patient    Dahlia ByesBerkelhammer, Istvan Behar [facility] 05/08/2017, 2:41 PM

## 2017-05-08 NOTE — Progress Notes (Signed)
Notified Alma Downsheresa Tollison from "family connects" about the order from OB/GYN for a "Baby Love Nurse" visit after diischarge home.

## 2017-05-08 NOTE — Discharge Instructions (Signed)
Postpartum Care After Vaginal Delivery °The period of time right after you deliver your newborn is called the postpartum period. °What kind of medical care will I receive? °· You may continue to receive fluids and medicines through an IV tube inserted into one of your veins. °· If an incision was made near your vagina (episiotomy) or if you had some vaginal tearing during delivery, cold compresses may be placed on your episiotomy or your tear. This helps to reduce pain and swelling. °· You may be given a squirt bottle to use when you go to the bathroom. You may use this until you are comfortable wiping as usual. To use the squirt bottle, follow these steps: °? Before you urinate, fill the squirt bottle with warm water. Do not use hot water. °? After you urinate, while you are sitting on the toilet, use the squirt bottle to rinse the area around your urethra and vaginal opening. This rinses away any urine and blood. °? You may do this instead of wiping. As you start healing, you may use the squirt bottle before wiping yourself. Make sure to wipe gently. °? Fill the squirt bottle with clean water every time you use the bathroom. °· You will be given sanitary pads to wear. °How can I expect to feel? °· You may not feel the need to urinate for several hours after delivery. °· You will have some soreness and pain in your abdomen and vagina. °· If you are breastfeeding, you may have uterine contractions every time you breastfeed for up to several weeks postpartum. Uterine contractions help your uterus return to its normal size. °· It is normal to have vaginal bleeding (lochia) after delivery. The amount and appearance of lochia is often similar to a menstrual period in the first week after delivery. It will gradually decrease over the next few weeks to a dry, yellow-brown discharge. For most women, lochia stops completely by 6-8 weeks after delivery. Vaginal bleeding can vary from woman to woman. °· Within the first few  days after delivery, you may have breast engorgement. This is when your breasts feel heavy, full, and uncomfortable. Your breasts may also throb and feel hard, tightly stretched, warm, and tender. After this occurs, you may have milk leaking from your breasts. Your health care provider can help you relieve discomfort due to breast engorgement. Breast engorgement should go away within a few days. °· You may feel more sad or worried than normal due to hormonal changes after delivery. These feelings should not last more than a few days. If these feelings do not go away after several days, speak with your health care provider. °How should I care for myself? °· Tell your health care provider if you have pain or discomfort. °· Drink enough water to keep your urine clear or pale yellow. °· Wash your hands thoroughly with soap and water for at least 20 seconds after changing your sanitary pads, after using the toilet, and before holding or feeding your baby. °· If you are not breastfeeding, avoid touching your breasts a lot. Doing this can make your breasts produce more milk. °· If you become weak or lightheaded, or you feel like you might faint, ask for help before: °? Getting out of bed. °? Showering. °· Change your sanitary pads frequently. Watch for any changes in your flow, such as a sudden increase in volume, a change in color, the passing of large blood clots. If you pass a blood clot from your vagina, save it   to show to your health care provider. Do not flush blood clots down the toilet without having your health care provider look at them. °· Make sure that all your vaccinations are up to date. This can help protect you and your baby from getting certain diseases. You may need to have immunizations done before you leave the hospital. °· If desired, talk with your health care provider about methods of family planning or birth control (contraception). °How can I start bonding with my baby? °Spending as much time as  possible with your baby is very important. During this time, you and your baby can get to know each other and develop a bond. Having your baby stay with you in your room (rooming in) can give you time to get to know your baby. Rooming in can also help you become comfortable caring for your baby. Breastfeeding can also help you bond with your baby. °How can I plan for returning home with my baby? °· Make sure that you have a car seat installed in your vehicle. °? Your car seat should be checked by a certified car seat installer to make sure that it is installed safely. °? Make sure that your baby fits into the car seat safely. °· Ask your health care provider any questions you have about caring for yourself or your baby. Make sure that you are able to contact your health care provider with any questions after leaving the hospital. °This information is not intended to replace advice given to you by your health care provider. Make sure you discuss any questions you have with your health care provider. °Document Released: 11/07/2006 Document Revised: 06/15/2015 Document Reviewed: 12/15/2014 °Elsevier Interactive Patient Education © 2018 Elsevier Inc. ° °

## 2017-05-08 NOTE — Progress Notes (Signed)
CSW communicated with [facility], Bernie CoveyPam Miller.  MOB does not currently have an open case with CPS.  There are no barriers to infants d/c with MOB.  CSW will continue to monitor infant's UDS and will make a report if results are positive without an explanation.   CSW updated Teaching Service.  Blaine HamperAngel Boyd-Gilyard, MSW, LCSW Clinical Social Work [phone]

## 2017-05-08 NOTE — Discharge Summary (Addendum)
OB Discharge Summary     Patient Name: Mikayla Lopez DOB: Oct 04, 1980 MRN: [id number]  Date of admission: 05/05/2017 Delivering MD: Wendee Beavers   Date of discharge: 05/08/2017  Admitting diagnosis: induction  Intrauterine pregnancy: [redacted]w[redacted]d     Secondary diagnosis:  Active Problems:   Gestational hypertension  Additional problems: AMA     Discharge diagnosis: Term Pregnancy Delivered and Gestational Hypertension                                                                                                Post partum procedures:none  Augmentation: Pitocin and Cytotec  Complications: None  Hospital course:  Onset of Labor With Vaginal Delivery     37 y.o. yo [id number] at [redacted]w[redacted]d was admitted in Latent Labor on 05/05/2017. Patient had an uncomplicated labor course as follows:  Membrane Rupture Time/Date: 11:49 PM ,05/05/2017   Intrapartum Procedures: Episiotomy: None [1]                                         Lacerations:  None [1]  Patient had a delivery of a Viable infant. 05/06/2017  Information for the patient's newborn:  Zaniyah, Wernette [[id number]]  Delivery Method: Vaginal, Spontaneous(Filed from Delivery Summary)    Pateint had an uncomplicated postpartum course.  She is ambulating, tolerating a regular diet, passing flatus, and urinating well. Patient is discharged home in stable condition on 05/08/17. BPs wnl at time of discharge.   Physical exam  Vitals:   05/07/17 0427 05/07/17 1804 05/07/17 1808 05/08/17 0531  BP: 126/72 (!) 141/87 130/82 (!) 147/82  Pulse: (!) 59 74 74 62  Resp: 18 19  20   Temp: 98 F (36.7 C) 98.4 F (36.9 C)  98.1 F (36.7 C)  TempSrc:  Oral  Oral  SpO2: 96% 100%  99%  Weight:      Height:       General: alert and no distress Lochia: appropriate Uterine Fundus: firm Incision: N/A DVT Evaluation: No evidence of DVT seen on physical exam. Labs: Lab Results  Component Value Date   WBC 15.6 (H) 05/06/2017   HGB 10.8 (L)  05/06/2017   HCT 32.3 (L) 05/06/2017   MCV 82.6 05/06/2017   PLT 181 05/06/2017   CMP Latest Ref Rng & Units 05/05/2017  Glucose 65 - 99 mg/dL 77  BUN 6 - 20 mg/dL 12  Creatinine 4.40 - 1.02 mg/dL 7.25  Sodium 366 - 440 mmol/L 138  Potassium 3.5 - 5.1 mmol/L 4.0  Chloride 101 - 111 mmol/L 107  CO2 22 - 32 mmol/L 21(L)  Calcium 8.9 - 10.3 mg/dL 3.4(V)  Total Protein 6.5 - 8.1 g/dL 6.1(L)  Total Bilirubin 0.3 - 1.2 mg/dL 0.5  Alkaline Phos 38 - 126 U/L 116  AST 15 - 41 U/L 15  ALT 14 - 54 U/L 14    Discharge instruction: per After Visit Summary and "Baby and Me Booklet".  After visit meds:  Allergies as of 05/08/2017   No  Known Allergies     Medication List    STOP taking these medications   docusate sodium 100 MG capsule Commonly known as:  COLACE   multivitamin-prenatal 27-0.8 MG Tabs tablet     TAKE these medications   PROVENTIL HFA 108 (90 Base) MCG/ACT inhaler Generic drug:  albuterol 2 PUFF EVERY 4 HOURS AS NEEDED INHALATION       Diet: routine diet  Activity: Advance as tolerated. Pelvic rest for 6 weeks.   Outpatient follow up:6 weeks Follow up Appt:No future appointments. Follow up Visit: Follow-up Information    Department, [facility]. Go to.   Why:  6 weeks Contact information: [address] [place] [address] [phone]            Postpartum contraception: Nexplanon  Newborn Data: Live born female  Birth Weight: 7 lb 1.4 oz (3215 g) APGAR: 9, 9  Newborn Delivery   Birth date/time:  05/06/2017 20:40:00 Delivery type:  Vaginal, Spontaneous     Baby Feeding: Breast Disposition:home with mother   05/08/2017 Wendee Beaversavid J McMullen, DO  OB FELLOW DISCHARGE ATTESTATION  I have seen and examined this patient. I agree with above documentation and have made edits as needed.   Caryl AdaJazma Phelps, DO OB Fellow 9:38 AM

## 2017-05-12 ENCOUNTER — Telehealth (HOSPITAL_COMMUNITY): Payer: Self-pay | Admitting: Lactation Services

## 2017-05-12 NOTE — Telephone Encounter (Signed)
Patient called. She has had very full breasts for the last 2 days and infant sometimes has a hard time latching (initially). Mom does not have a hand pump or a working electric pump at home. I talked her through how to do hand expression and she was able to do so with good results into a bowl. I also talked about how to do reverse pressure softening with areola to help her nipples evert more to assist w/latching. Cold compresses also discussed, if needed.   Patient very appreciative of help. Per Mom, infant is feeding well & has had 5 yellow stools today.   Glenetta HewKim Jwan Hornbaker, RN, IBCLC

## 2017-11-05 ENCOUNTER — Ambulatory Visit (HOSPITAL_COMMUNITY)
Admission: EM | Admit: 2017-11-05 | Discharge: 2017-11-05 | Disposition: A | Discharge status: Home / Self Care | Payer: Self-pay | Attending: Family Medicine | Admitting: Family Medicine

## 2017-11-05 ENCOUNTER — Ambulatory Visit (INDEPENDENT_AMBULATORY_CARE_PROVIDER_SITE_OTHER): Payer: Self-pay

## 2017-11-05 ENCOUNTER — Encounter (HOSPITAL_COMMUNITY): Payer: Self-pay | Admitting: Emergency Medicine

## 2017-11-05 DIAGNOSIS — T07XXXA Unspecified multiple injuries, initial encounter: Secondary | ICD-10-CM

## 2017-11-05 DIAGNOSIS — F4310 Post-traumatic stress disorder, unspecified: Secondary | ICD-10-CM

## 2017-11-05 MED ORDER — ACETAMINOPHEN 325 MG PO TABS
ORAL_TABLET | ORAL | Status: AC
  Filled 2017-11-05: qty 2

## 2017-11-05 MED ORDER — FLUOXETINE HCL 10 MG PO TABS: 10.0000 mg | ORAL_TABLET | Freq: Every day | ORAL | 3 refills | Status: AC

## 2017-11-05 MED ORDER — MUPIROCIN 2 % EX OINT: 1.0000 "application " | TOPICAL_OINTMENT | Freq: Three times a day (TID) | CUTANEOUS | 1 refills | Status: AC

## 2017-11-05 MED ORDER — TRAMADOL HCL 50 MG PO TABS: 50.0000 mg | ORAL_TABLET | Freq: Four times a day (QID) | ORAL | 0 refills | Status: DC | PRN

## 2017-11-05 MED ORDER — ACETAMINOPHEN 325 MG PO TABS
650.0000 mg | ORAL_TABLET | Freq: Once | ORAL | Status: AC
  Administered 2017-11-05: 650 mg via ORAL
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
## 2017-11-09 ENCOUNTER — Telehealth (HOSPITAL_COMMUNITY): Payer: Self-pay | Admitting: Family Medicine

## 2017-11-09 ENCOUNTER — Telehealth (HOSPITAL_COMMUNITY): Payer: Self-pay | Admitting: Emergency Medicine

## 2017-11-09 MED ORDER — TRAMADOL HCL 50 MG PO TABS: 50.0000 mg | ORAL_TABLET | Freq: Four times a day (QID) | ORAL | 0 refills | Status: DC | PRN

## 2017-11-09 NOTE — Telephone Encounter (Signed)
New prescription for tramadol sent to pharmacy
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
## 2021-07-28 ENCOUNTER — Encounter (HOSPITAL_COMMUNITY): Payer: Self-pay

## 2021-07-28 ENCOUNTER — Inpatient Hospital Stay (HOSPITAL_COMMUNITY)
Admission: AD | Admit: 2021-07-28 | Discharge: 2021-07-31 | DRG: 819 | Disposition: A | Discharge status: Home / Self Care | Payer: Medicaid Other | Attending: Obstetrics & Gynecology | Admitting: Obstetrics & Gynecology

## 2021-07-28 DIAGNOSIS — O008 Other ectopic pregnancy without intrauterine pregnancy: Principal | ICD-10-CM | POA: Diagnosis present

## 2021-07-28 DIAGNOSIS — Z8759 Personal history of other complications of pregnancy, childbirth and the puerperium: Secondary | ICD-10-CM

## 2021-07-28 DIAGNOSIS — Z3A09 9 weeks gestation of pregnancy: Secondary | ICD-10-CM

## 2021-07-28 HISTORY — DX: Maternal care for (suspected) hereditary disease in fetus, fetus 1: O35.2XX1

## 2021-07-28 LAB — URINALYSIS, ROUTINE W REFLEX MICROSCOPIC
Bilirubin Urine: NEGATIVE
Glucose, UA: NEGATIVE mg/dL
Ketones, ur: NEGATIVE mg/dL
Leukocytes,Ua: NEGATIVE
Nitrite: NEGATIVE
Protein, ur: NEGATIVE mg/dL
RBC / HPF: >50 RBC/hpf — ABNORMAL HIGH (ref 0–5)
Specific Gravity, Urine: 1.014 (ref 1.005–1.030)
pH: 6 (ref 5.0–8.0)

## 2021-07-28 NOTE — MAU Note (Signed)
.  Mikayla Lopez is a 41 y.o. at Unknown here in MAU reporting: sharp and "knotting" ABD pain for 2 weeks, VB spotting for the last 4 days but today bright red VB heavy like a period. Pt was wearing a pad, and stated it was mostly saturated. Pt reports she has not taken anything for the pain today, but tried a muscle relaxer she got from her daughter that did not help. Pt stated she notice some foul discharge with the bleeding. Pt denies recent intercourse and LOF. LMP: 05/24/2021 Onset of complaint: 4 days  Pain score: 8/10 Vitals:   07/28/21 2336  BP: (!) 118/53  Pulse: (!) 57  Resp: 18  Temp: 98.8 F (37.1 C)  SpO2: 100%      Lab orders placed from triage:  UPT and UA

## 2021-07-28 NOTE — MAU Provider Note (Signed)
Chief Complaint: Abdominal Pain and Vaginal Bleeding   Event Date/Time   First Provider Initiated Contact with Patient 07/28/21 2355        SUBJECTIVE HPI: Mikayla Lopez is a 41 y.o. [id number] at [redacted]w[redacted]d by LMP who presents to maternity admissions reporting sharp abdominal pains for 2 weeks and bleeding for the past 4 days.  Took a muscle relaxer today which did not help . She denies urinary symptoms, h/a, dizziness, n/v, or fever/chills.    Abdominal Pain This is a new problem. The current episode started 1 to 4 weeks ago. The problem occurs intermittently. The problem has been unchanged. The quality of the pain is cramping and sharp. The abdominal pain does not radiate. Pertinent negatives include no fever, headaches, myalgias, nausea or vomiting. The pain is aggravated by palpation. The pain is relieved by Nothing. Treatments tried: Flexeril. The treatment provided no relief.  Vaginal Bleeding The patient's primary symptoms include pelvic pain and vaginal bleeding. The patient's pertinent negatives include no genital itching, genital lesions or genital odor. This is a new problem. The current episode started in the past 7 days. The problem has been unchanged. She is pregnant. Associated symptoms include abdominal pain. Pertinent negatives include no fever, headaches, nausea or vomiting. The vaginal discharge was bloody. The vaginal bleeding is typical of menses. She has not been passing clots. She has not been passing tissue. Nothing aggravates the symptoms. She has tried nothing for the symptoms.   RN note: Mikayla Lopez is a 41 y.o. at Unknown here in MAU reporting: sharp and "knotting" ABD pain for 2 weeks, VB spotting for the last 4 days but today bright red VB heavy like a period. Pt was wearing a pad, and stated it was mostly saturated. Pt reports she has not taken anything for the pain today, but tried a muscle relaxer she got from her daughter that did not help. Pt stated she notice some foul  discharge with the bleeding. Pt denies recent intercourse and LOF. LMP: 05/24/2021 Onset of complaint: 4 days  Pain score: 8/10  Past Medical History:  Diagnosis Date   Asthma    Past Surgical History:  Procedure Laterality Date   ADENOIDECTOMY     Social History   Socioeconomic History   Marital status: Single    Spouse name: Not on file   Number of children: Not on file   Years of education: Not on file   Highest education level: Not on file  Occupational History   Not on file  Tobacco Use   Smoking status: Never   Smokeless tobacco: Never  Vaping Use   Vaping Use: Former  Substance and Sexual Activity   Alcohol use: Not Currently    Comment: Pt states socially   Drug use: Yes    Types: Marijuana    Comment: last use 07/27/2021   Sexual activity: Not Currently  Other Topics Concern   Not on file  Social History Narrative   Not on file   Social Determinants of Health   Financial Resource Strain: Not on file  Food Insecurity: Not on file  Transportation Needs: Not on file  Physical Activity: Not on file  Stress: Not on file  Social Connections: Not on file  Intimate Partner Violence: Not on file   Current Facility-Administered Medications on File Prior to Encounter  Medication Dose Route Frequency Provider Last Rate Last Admin   lidocaine 2 % w/epi 1:200,000 (20 ml) with sod bicarb 8.4 % (2 ml) inj  Continuous PRN Graciela Husbands, CRNA   5 mL at 05/06/17 1821   Current Outpatient Medications on File Prior to Encounter  Medication Sig Dispense Refill   FLUoxetine (PROZAC) 10 MG tablet Take 1 tablet (10 mg total) by mouth daily. 21 tablet 3   mupirocin ointment (BACTROBAN) 2 % Apply 1 application topically 3 (three) times daily. 30 g 1   PROVENTIL HFA 108 (90 Base) MCG/ACT inhaler 2 PUFF EVERY 4 HOURS AS NEEDED INHALATION  1   traMADol (ULTRAM) 50 MG tablet Take 1 tablet (50 mg total) by mouth every 6 (six) hours as needed. 15 tablet 0   No Known  Allergies  I have reviewed patient's Past Medical Hx, Surgical Hx, Family Hx, Social Hx, medications and allergies.   ROS:  Review of Systems  Constitutional:  Negative for fever.  Gastrointestinal:  Positive for abdominal pain. Negative for nausea and vomiting.  Genitourinary:  Positive for pelvic pain and vaginal bleeding.  Musculoskeletal:  Negative for myalgias.  Neurological:  Negative for headaches.   Review of Systems  Other systems negative   Physical Exam  Physical Exam Patient Vitals for the past 24 hrs:  BP Temp Temp src Pulse Resp SpO2 Height Weight  07/28/21 2336 (!) 118/53 98.8 F (37.1 C) Oral (!) 57 18 100 % 5\' 3"  (1.6 m) 99.6 kg   Constitutional: Well-developed, well-nourished female in no acute distress.  Cardiovascular: normal rate Respiratory: normal effort GI: Abd soft, tender over lower abdomen. Mild guarding MS: Extremities nontender, no edema, normal ROM Neurologic: Alert and oriented x 4.  GU: Neg CVAT.  PELVIC EXAM: Deferred in lieu of transvaginal ultrasound  LAB RESULTS Results for orders placed or performed during the hospital encounter of 07/28/21 (from the past 24 hour(s))  Urinalysis, Routine w reflex microscopic     Status: Abnormal   Collection Time: 07/28/21 11:28 PM  Result Value Ref Range   Color, Urine YELLOW YELLOW   APPearance CLEAR CLEAR   Specific Gravity, Urine 1.014 1.005 - 1.030   pH 6.0 5.0 - 8.0   Glucose, UA NEGATIVE NEGATIVE mg/dL   Hgb urine dipstick LARGE (A) NEGATIVE   Bilirubin Urine NEGATIVE NEGATIVE   Ketones, ur NEGATIVE NEGATIVE mg/dL   Protein, ur NEGATIVE NEGATIVE mg/dL   Nitrite NEGATIVE NEGATIVE   Leukocytes,Ua NEGATIVE NEGATIVE   RBC / HPF >50 (H) 0 - 5 RBC/hpf   WBC, UA 0-5 0 - 5 WBC/hpf   Bacteria, UA RARE (A) NONE SEEN   Squamous Epithelial / LPF 0-5 0 - 5   Mucus PRESENT   hCG, quantitative, pregnancy     Status: Abnormal   Collection Time: 07/29/21 12:05 AM  Result Value Ref Range   hCG, Beta  Chain, Quant, S 38,978 (H) <5 mIU/mL  CBC     Status: Abnormal   Collection Time: 07/29/21 12:05 AM  Result Value Ref Range   WBC 6.5 4.0 - 10.5 K/uL   RBC 3.94 3.87 - 5.11 MIL/uL   Hemoglobin 10.6 (L) 12.0 - 15.0 g/dL   HCT 09/29/21 (L) 68.0 - 32.1 %   MCV 84.0 80.0 - 100.0 fL   MCH 26.9 26.0 - 34.0 pg   MCHC 32.0 30.0 - 36.0 g/dL   RDW 22.4 82.5 - 00.3 %   Platelets 302 150 - 400 K/uL   nRBC 0.0 0.0 - 0.2 %  Comprehensive metabolic panel     Status: Abnormal   Collection Time: 07/29/21 12:05 AM  Result Value Ref  Range   Sodium 136 135 - 145 mmol/L   Potassium 3.8 3.5 - 5.1 mmol/L   Chloride 104 98 - 111 mmol/L   CO2 25 22 - 32 mmol/L   Glucose, Bld 97 70 - 99 mg/dL   BUN 9 6 - 20 mg/dL   Creatinine, Ser 6.21 0.44 - 1.00 mg/dL   Calcium 9.0 8.9 - 30.8 mg/dL   Total Protein 6.4 (L) 6.5 - 8.1 g/dL   Albumin 3.2 (L) 3.5 - 5.0 g/dL   AST 11 (L) 15 - 41 U/L   ALT 12 0 - 44 U/L   Alkaline Phosphatase 45 38 - 126 U/L   Total Bilirubin 0.2 (L) 0.3 - 1.2 mg/dL   GFR, Estimated >65 >78 mL/min   Anion gap 7 5 - 15  Wet prep, genital     Status: Abnormal   Collection Time: 07/29/21 12:23 AM  Result Value Ref Range   Yeast Wet Prep HPF POC NONE SEEN NONE SEEN   Trich, Wet Prep NONE SEEN NONE SEEN   Clue Cells Wet Prep HPF POC PRESENT (A) NONE SEEN   WBC, Wet Prep HPF POC <10 <10   Sperm NONE SEEN      IMAGING .US OB LESS THAN 14 WEEKS WITH OB TRANSVAGINAL  Addendum Date: 07/29/2021   ADDENDUM REPORT: 07/29/2021 02:12 ADDENDUM: After continued evaluation of the images, and subsequent discussion with Dr. Mayford Knife, the findings described above represent sequelae associated with a coronal ectopic pregnancy. Electronically Signed   By: Aram Candela M.D.   On: 07/29/2021 02:12   Result Date: 07/29/2021 CLINICAL DATA:  Pain x2 weeks with heavy vaginal bleeding x4 days. EXAM: OBSTETRIC <14 WK [place] AND TRANSVAGINAL OB US TECHNIQUE: Both transabdominal and transvaginal ultrasound examinations  were performed for complete evaluation of the gestation as well as the maternal uterus, adnexal regions, and pelvic cul-de-sac. Transvaginal technique was performed to assess early pregnancy. COMPARISON:  None Available. FINDINGS: Intrauterine gestational sac: None Yolk sac:  Not Visualized. Embryo:  Not Visualized. Cardiac Activity: Not Visualized. Heart Rate: N/A  bpm Maternal uterus/adnexae: A 2.9 cm x 2.6 cm x 2.6 cm heterogeneous uterine fibroid is noted. A 3.4 cm x 3.2 cm x 3.4 cm vascular area of heterogeneous echogenicity is seen within the uterus on the right. This is along an area of intense pain during the examination, as per the ultrasound technologist. The bilateral ovaries are visualized and are normal in appearance. No pelvic free fluid is seen. IMPRESSION: 1. Findings, as described above, which may represent sequelae associated with a cornual ectopic pregnancy. Correlation with follow-up pelvic ultrasound and serial beta HCG levels is recommended. Electronically Signed: By: Aram Candela M.D. On: 07/29/2021 01:50     MAU Management/MDM: I have reviewed the triage vital signs and the nursing notes.   Pertinent labs & imaging results that were available during my care of the patient were reviewed by me and considered in my medical decision making (see chart for details).      I have reviewed her medical records including past results, notes and treatments.   Ordered usual first trimester r/o ectopic labs.   Pelvic cultures done Will check baseline Ultrasound to rule out ectopic.  US revealed a R cornual ectopic pregnancy  Consult Dr Debroah Loop with presentation, exam findings, and results.  He came down and examined the patient and discussed her diagnosis and treament plan. Treatments in MAU included IV and analgesia.   This bleeding/pain can represent a normal  pregnancy with bleeding, spontaneous abortion or even an ectopic which can be life-threatening.  The process as listed above  helps to determine which of these is present.    ASSESSMENT Pregnancy at [redacted]w[redacted]d by LMP Right cornual ectopic pregnancy Bleeding in pregnancy Pelvic pain   PLAN Admit for observation and then surgery MD to follow    Wynelle Bourgeois CNM, MSN Certified Nurse-Midwife 07/28/2021  11:55 PM

## 2021-07-29 ENCOUNTER — Observation Stay (HOSPITAL_COMMUNITY): Payer: Medicaid Other | Admitting: Anesthesiology

## 2021-07-29 ENCOUNTER — Observation Stay (HOSPITAL_BASED_OUTPATIENT_CLINIC_OR_DEPARTMENT_OTHER): Payer: Medicaid Other | Admitting: Anesthesiology

## 2021-07-29 ENCOUNTER — Other Ambulatory Visit: Payer: Self-pay

## 2021-07-29 ENCOUNTER — Encounter (HOSPITAL_COMMUNITY)
Admission: AD | Disposition: A | Discharge status: Home / Self Care | Payer: Self-pay | Source: Home / Self Care | Attending: Obstetrics & Gynecology

## 2021-07-29 ENCOUNTER — Encounter (HOSPITAL_COMMUNITY): Payer: Self-pay | Admitting: Obstetrics & Gynecology

## 2021-07-29 ENCOUNTER — Inpatient Hospital Stay (HOSPITAL_COMMUNITY): Payer: Medicaid Other

## 2021-07-29 DIAGNOSIS — O009 Unspecified ectopic pregnancy without intrauterine pregnancy: Secondary | ICD-10-CM

## 2021-07-29 DIAGNOSIS — Z3A09 9 weeks gestation of pregnancy: Secondary | ICD-10-CM

## 2021-07-29 DIAGNOSIS — O008 Other ectopic pregnancy without intrauterine pregnancy: Secondary | ICD-10-CM | POA: Diagnosis present

## 2021-07-29 DIAGNOSIS — Z8759 Personal history of other complications of pregnancy, childbirth and the puerperium: Secondary | ICD-10-CM

## 2021-07-29 DIAGNOSIS — R102 Pelvic and perineal pain: Secondary | ICD-10-CM | POA: Diagnosis present

## 2021-07-29 HISTORY — PX: BILATERAL SALPINGECTOMY: SHX5743

## 2021-07-29 HISTORY — PX: DIAGNOSTIC LAPAROSCOPY WITH REMOVAL OF ECTOPIC PREGNANCY: SHX6449

## 2021-07-29 HISTORY — PX: DILATION AND CURETTAGE OF UTERUS: SHX78

## 2021-07-29 LAB — COMPREHENSIVE METABOLIC PANEL
ALT: 12 U/L (ref 0–44)
AST: 11 U/L — ABNORMAL LOW (ref 15–41)
Albumin: 3.2 g/dL — ABNORMAL LOW (ref 3.5–5.0)
Alkaline Phosphatase: 45 U/L (ref 38–126)
Anion gap: 7 (ref 5–15)
BUN: 9 mg/dL (ref 6–20)
CO2: 25 mmol/L (ref 22–32)
Calcium: 9 mg/dL (ref 8.9–10.3)
Chloride: 104 mmol/L (ref 98–111)
Creatinine, Ser: 0.68 mg/dL (ref 0.44–1.00)
GFR, Estimated: >60 mL/min (ref >60)
Glucose, Bld: 97 mg/dL (ref 70–99)
Potassium: 3.8 mmol/L (ref 3.5–5.1)
Sodium: 136 mmol/L (ref 135–145)
Total Bilirubin: 0.2 mg/dL — ABNORMAL LOW (ref 0.3–1.2)
Total Protein: 6.4 g/dL — ABNORMAL LOW (ref 6.5–8.1)

## 2021-07-29 LAB — WET PREP, GENITAL
Sperm: NONE SEEN
Trich, Wet Prep: NONE SEEN
WBC, Wet Prep HPF POC: <10 (ref <10)
Yeast Wet Prep HPF POC: NONE SEEN

## 2021-07-29 LAB — CBC
HCT: 33.1 % — ABNORMAL LOW (ref 36.0–46.0)
Hemoglobin: 10.6 g/dL — ABNORMAL LOW (ref 12.0–15.0)
MCH: 26.9 pg (ref 26.0–34.0)
MCHC: 32 g/dL (ref 30.0–36.0)
MCV: 84 fL (ref 80.0–100.0)
Platelets: 302 10*3/uL (ref 150–400)
RBC: 3.94 MIL/uL (ref 3.87–5.11)
RDW: 14.5 % (ref 11.5–15.5)
WBC: 6.5 10*3/uL (ref 4.0–10.5)
nRBC: 0 % (ref 0.0–0.2)

## 2021-07-29 LAB — TYPE AND SCREEN
ABO/RH(D): A POS
Antibody Screen: NEGATIVE

## 2021-07-29 LAB — POCT PREGNANCY, URINE: Preg Test, Ur: POSITIVE — AB

## 2021-07-29 LAB — HCG, QUANTITATIVE, PREGNANCY: hCG, Beta Chain, Quant, S: 38978 m[IU]/mL — ABNORMAL HIGH (ref <5)

## 2021-07-29 SURGERY — LAPAROSCOPY, WITH ECTOPIC PREGNANCY SURGICAL TREATMENT
Anesthesia: General | Site: Abdomen | Laterality: Right

## 2021-07-29 MED ORDER — ROCURONIUM BROMIDE 10 MG/ML (PF) SYRINGE
PREFILLED_SYRINGE | INTRAVENOUS | Status: DC | PRN
  Administered 2021-07-29: 50 mg via INTRAVENOUS

## 2021-07-29 MED ORDER — HYDROMORPHONE HCL 1 MG/ML IJ SOLN
1.0000 mg | INTRAMUSCULAR | Status: DC | PRN
  Administered 2021-07-29 – 2021-07-30 (×3): 1 mg via INTRAVENOUS
  Administered 2021-07-30: 2 mg via INTRAVENOUS
  Administered 2021-07-30 (×2): 1 mg via INTRAVENOUS
  Administered 2021-07-31: 2 mg via INTRAVENOUS
  Filled 2021-07-29: qty 2
  Filled 2021-07-29 (×2): qty 1
  Filled 2021-07-29: qty 2
  Filled 2021-07-29 (×2): qty 1
  Filled 2021-07-29: qty 2
  Filled 2021-07-29: qty 1

## 2021-07-29 MED ORDER — LACTATED RINGERS IV SOLN: INTRAVENOUS | Status: DC

## 2021-07-29 MED ORDER — PROPOFOL 10 MG/ML IV BOLUS
INTRAVENOUS | Status: DC | PRN
  Administered 2021-07-29: 180 mg via INTRAVENOUS

## 2021-07-29 MED ORDER — HYDROMORPHONE HCL 1 MG/ML IJ SOLN
1.0000 mg | INTRAMUSCULAR | Status: DC | PRN
  Administered 2021-07-29: 1 mg via INTRAVENOUS
  Filled 2021-07-29: qty 1

## 2021-07-29 MED ORDER — ONDANSETRON HCL 4 MG/2ML IJ SOLN
INTRAMUSCULAR | Status: DC | PRN
  Administered 2021-07-29: 4 mg via INTRAVENOUS

## 2021-07-29 MED ORDER — ALBUTEROL SULFATE (2.5 MG/3ML) 0.083% IN NEBU: 3.0000 mL | INHALATION_SOLUTION | RESPIRATORY_TRACT | Status: DC | PRN

## 2021-07-29 MED ORDER — SUCCINYLCHOLINE CHLORIDE 200 MG/10ML IV SOSY
PREFILLED_SYRINGE | INTRAVENOUS | Status: AC
  Filled 2021-07-29: qty 10

## 2021-07-29 MED ORDER — SODIUM CHLORIDE 0.9 % IR SOLN
Status: DC | PRN
  Administered 2021-07-29: 1

## 2021-07-29 MED ORDER — SENNOSIDES-DOCUSATE SODIUM 8.6-50 MG PO TABS: 1.0000 | ORAL_TABLET | Freq: Every evening | ORAL | Status: DC | PRN

## 2021-07-29 MED ORDER — ROCURONIUM BROMIDE 10 MG/ML (PF) SYRINGE
PREFILLED_SYRINGE | INTRAVENOUS | Status: AC
  Filled 2021-07-29: qty 10

## 2021-07-29 MED ORDER — GABAPENTIN 300 MG PO CAPS
300.0000 mg | ORAL_CAPSULE | Freq: Three times a day (TID) | ORAL | Status: DC
Start: 2021-07-29 — End: 2021-08-01
  Administered 2021-07-29 – 2021-07-31 (×7): 300 mg via ORAL
  Filled 2021-07-29 (×7): qty 1

## 2021-07-29 MED ORDER — BISACODYL 10 MG RE SUPP: 10.0000 mg | Freq: Every day | RECTAL | Status: DC | PRN

## 2021-07-29 MED ORDER — DEXMEDETOMIDINE (PRECEDEX) IN NS 20 MCG/5ML (4 MCG/ML) IV SYRINGE
PREFILLED_SYRINGE | INTRAVENOUS | Status: DC | PRN
  Administered 2021-07-29: 12 ug via INTRAVENOUS

## 2021-07-29 MED ORDER — SUGAMMADEX SODIUM 200 MG/2ML IV SOLN
INTRAVENOUS | Status: DC | PRN
  Administered 2021-07-29: 200 mg via INTRAVENOUS

## 2021-07-29 MED ORDER — ACETAMINOPHEN 10 MG/ML IV SOLN
1000.0000 mg | Freq: Once | INTRAVENOUS | Status: DC | PRN
  Administered 2021-07-29: 1000 mg via INTRAVENOUS

## 2021-07-29 MED ORDER — SUCCINYLCHOLINE CHLORIDE 200 MG/10ML IV SOSY
PREFILLED_SYRINGE | INTRAVENOUS | Status: DC | PRN
  Administered 2021-07-29: 160 mg via INTRAVENOUS

## 2021-07-29 MED ORDER — HEMOSTATIC AGENTS (NO CHARGE) OPTIME
TOPICAL | Status: DC | PRN
  Administered 2021-07-29: 1 via TOPICAL

## 2021-07-29 MED ORDER — FENTANYL CITRATE (PF) 250 MCG/5ML IJ SOLN
INTRAMUSCULAR | Status: AC
  Filled 2021-07-29: qty 5

## 2021-07-29 MED ORDER — ALUM & MAG HYDROXIDE-SIMETH 200-200-20 MG/5ML PO SUSP: 30.0000 mL | ORAL | Status: DC | PRN

## 2021-07-29 MED ORDER — ZOLPIDEM TARTRATE 5 MG PO TABS: 5.0000 mg | ORAL_TABLET | Freq: Every evening | ORAL | Status: DC | PRN

## 2021-07-29 MED ORDER — DEXAMETHASONE SODIUM PHOSPHATE 4 MG/ML IJ SOLN
INTRAMUSCULAR | Status: DC | PRN
  Administered 2021-07-29: 10 mg via INTRAVENOUS

## 2021-07-29 MED ORDER — FENTANYL CITRATE (PF) 100 MCG/2ML IJ SOLN
INTRAMUSCULAR | Status: AC
  Filled 2021-07-29: qty 2

## 2021-07-29 MED ORDER — DEXAMETHASONE SODIUM PHOSPHATE 10 MG/ML IJ SOLN
INTRAMUSCULAR | Status: AC
  Filled 2021-07-29: qty 1

## 2021-07-29 MED ORDER — CEFAZOLIN SODIUM-DEXTROSE 2-4 GM/100ML-% IV SOLN
2.0000 g | INTRAVENOUS | Status: AC
  Administered 2021-07-29: 2 g via INTRAVENOUS

## 2021-07-29 MED ORDER — DOCUSATE SODIUM 100 MG PO CAPS
100.0000 mg | ORAL_CAPSULE | Freq: Two times a day (BID) | ORAL | Status: DC
  Administered 2021-07-29 – 2021-07-31 (×4): 100 mg via ORAL
  Filled 2021-07-29 (×4): qty 1

## 2021-07-29 MED ORDER — BUPIVACAINE HCL (PF) 0.5 % IJ SOLN: INTRAMUSCULAR | Status: DC | PRN

## 2021-07-29 MED ORDER — KETOROLAC TROMETHAMINE 30 MG/ML IJ SOLN
30.0000 mg | Freq: Four times a day (QID) | INTRAMUSCULAR | Status: AC
  Administered 2021-07-29 – 2021-07-30 (×4): 30 mg via INTRAVENOUS
  Filled 2021-07-29 (×4): qty 1

## 2021-07-29 MED ORDER — MIDAZOLAM HCL 5 MG/5ML IJ SOLN
INTRAMUSCULAR | Status: DC | PRN
  Administered 2021-07-29: 2 mg via INTRAVENOUS

## 2021-07-29 MED ORDER — MENTHOL 3 MG MT LOZG: 1.0000 | LOZENGE | OROMUCOSAL | Status: DC | PRN

## 2021-07-29 MED ORDER — ACETAMINOPHEN 10 MG/ML IV SOLN
INTRAVENOUS | Status: AC
  Filled 2021-07-29: qty 100

## 2021-07-29 MED ORDER — PANTOPRAZOLE SODIUM 40 MG PO TBEC
40.0000 mg | DELAYED_RELEASE_TABLET | Freq: Every day | ORAL | Status: DC
  Administered 2021-07-30 – 2021-07-31 (×2): 40 mg via ORAL
  Filled 2021-07-29 (×2): qty 1

## 2021-07-29 MED ORDER — KETOROLAC TROMETHAMINE 30 MG/ML IJ SOLN: 30.0000 mg | Freq: Once | INTRAMUSCULAR | Status: DC

## 2021-07-29 MED ORDER — FENTANYL CITRATE (PF) 100 MCG/2ML IJ SOLN
25.0000 ug | INTRAMUSCULAR | Status: DC | PRN
  Administered 2021-07-29 (×4): 50 ug via INTRAVENOUS

## 2021-07-29 MED ORDER — OXYCODONE HCL 5 MG PO TABS
5.0000 mg | ORAL_TABLET | ORAL | Status: DC | PRN
  Administered 2021-07-30 – 2021-07-31 (×5): 10 mg via ORAL
  Filled 2021-07-29 (×6): qty 2

## 2021-07-29 MED ORDER — STERILE WATER FOR IRRIGATION IR SOLN
Status: DC | PRN
  Administered 2021-07-29: 1000 mL

## 2021-07-29 MED ORDER — VASOPRESSIN 20 UNIT/ML IV SOLN
INTRAVENOUS | Status: DC | PRN
  Administered 2021-07-29: 25 mL via INTRAMUSCULAR

## 2021-07-29 MED ORDER — OXYCODONE HCL 5 MG/5ML PO SOLN: 5.0000 mg | Freq: Once | ORAL | Status: DC | PRN

## 2021-07-29 MED ORDER — LIDOCAINE 2% (20 MG/ML) 5 ML SYRINGE
INTRAMUSCULAR | Status: AC
  Filled 2021-07-29: qty 5

## 2021-07-29 MED ORDER — LIDOCAINE 2% (20 MG/ML) 5 ML SYRINGE
INTRAMUSCULAR | Status: DC | PRN
  Administered 2021-07-29: 60 mg via INTRAVENOUS

## 2021-07-29 MED ORDER — MIDAZOLAM HCL 2 MG/2ML IJ SOLN
INTRAMUSCULAR | Status: AC
  Filled 2021-07-29: qty 2

## 2021-07-29 MED ORDER — PRENATAL MULTIVITAMIN CH: 1.0000 | ORAL_TABLET | Freq: Every day | ORAL | Status: DC

## 2021-07-29 MED ORDER — BUPIVACAINE HCL (PF) 0.5 % IJ SOLN
INTRAMUSCULAR | Status: AC
  Filled 2021-07-29: qty 10

## 2021-07-29 MED ORDER — ONDANSETRON HCL 4 MG/2ML IJ SOLN
INTRAMUSCULAR | Status: AC
Start: 2021-07-29 — End: ?
  Filled 2021-07-29: qty 2

## 2021-07-29 MED ORDER — ACETAMINOPHEN 500 MG PO TABS
1000.0000 mg | ORAL_TABLET | Freq: Four times a day (QID) | ORAL | Status: DC
  Administered 2021-07-29 – 2021-07-31 (×8): 1000 mg via ORAL
  Filled 2021-07-29 (×8): qty 2

## 2021-07-29 MED ORDER — OXYCODONE-ACETAMINOPHEN 5-325 MG PO TABS
2.0000 | ORAL_TABLET | Freq: Once | ORAL | Status: AC
  Administered 2021-07-29: 2 via ORAL
  Filled 2021-07-29: qty 2

## 2021-07-29 MED ORDER — METHOTREXATE FOR ECTOPIC PREGNANCY
50.0000 mg/m2 | Freq: Once | INTRAMUSCULAR | Status: AC
  Administered 2021-07-29: 105 mg via INTRAMUSCULAR
  Filled 2021-07-29: qty 4.2
  Filled 2021-07-29: qty 10

## 2021-07-29 MED ORDER — CHLORHEXIDINE GLUCONATE 0.12 % MT SOLN
15.0000 mL | Freq: Once | OROMUCOSAL | Status: AC
  Administered 2021-07-29: 15 mL via OROMUCOSAL
  Filled 2021-07-29: qty 15

## 2021-07-29 MED ORDER — PHENYLEPHRINE 80 MCG/ML (10ML) SYRINGE FOR IV PUSH (FOR BLOOD PRESSURE SUPPORT)
PREFILLED_SYRINGE | INTRAVENOUS | Status: DC | PRN
  Administered 2021-07-29 (×2): 80 ug via INTRAVENOUS

## 2021-07-29 MED ORDER — ONDANSETRON HCL 4 MG PO TABS: 4.0000 mg | ORAL_TABLET | Freq: Four times a day (QID) | ORAL | Status: DC | PRN

## 2021-07-29 MED ORDER — ONDANSETRON HCL 4 MG/2ML IJ SOLN
4.0000 mg | Freq: Four times a day (QID) | INTRAMUSCULAR | Status: DC | PRN
  Administered 2021-07-30: 4 mg via INTRAVENOUS
  Filled 2021-07-29: qty 2

## 2021-07-29 MED ORDER — ACETAMINOPHEN 500 MG PO TABS: 1000.0000 mg | ORAL_TABLET | Freq: Once | ORAL | Status: DC | PRN

## 2021-07-29 MED ORDER — IBUPROFEN 600 MG PO TABS
600.0000 mg | ORAL_TABLET | Freq: Four times a day (QID) | ORAL | Status: DC
  Administered 2021-07-30 – 2021-07-31 (×4): 600 mg via ORAL
  Filled 2021-07-29 (×4): qty 1

## 2021-07-29 MED ORDER — SIMETHICONE 80 MG PO CHEW: 80.0000 mg | CHEWABLE_TABLET | Freq: Four times a day (QID) | ORAL | Status: DC | PRN

## 2021-07-29 MED ORDER — FENTANYL CITRATE (PF) 100 MCG/2ML IJ SOLN
INTRAMUSCULAR | Status: DC | PRN
  Administered 2021-07-29: 50 ug via INTRAVENOUS

## 2021-07-29 MED ORDER — FENTANYL CITRATE (PF) 250 MCG/5ML IJ SOLN
INTRAMUSCULAR | Status: DC | PRN
  Administered 2021-07-29 (×3): 50 ug via INTRAVENOUS

## 2021-07-29 MED ORDER — ORAL CARE MOUTH RINSE: 15.0000 mL | Freq: Once | OROMUCOSAL | Status: AC

## 2021-07-29 MED ORDER — OXYCODONE HCL 5 MG PO TABS: 5.0000 mg | ORAL_TABLET | Freq: Once | ORAL | Status: DC | PRN

## 2021-07-29 MED ORDER — ACETAMINOPHEN 160 MG/5ML PO SOLN: 1000.0000 mg | Freq: Once | ORAL | Status: DC | PRN

## 2021-07-29 SURGICAL SUPPLY — 51 items
ADH SKN CLS APL DERMABOND .7 (GAUZE/BANDAGES/DRESSINGS) ×2
BAG SPEC RTRVL LRG 6X4 10 (ENDOMECHANICALS)
CABLE HIGH FREQUENCY MONO STRZ (ELECTRODE) IMPLANT
CELLS DAT CNTRL 66122 CELL SVR (MISCELLANEOUS) ×2 IMPLANT
CONT SPEC 4OZ CLIKSEAL STRL BL (MISCELLANEOUS) ×2 IMPLANT
DERMABOND ADVANCED (GAUZE/BANDAGES/DRESSINGS) ×1
DERMABOND ADVANCED .7 DNX12 (GAUZE/BANDAGES/DRESSINGS) ×3 IMPLANT
DRAPE CESAREAN BIRTH W POUCH (DRAPES) ×1 IMPLANT
DRAPE INCISE 13X13 STRL (DRAPES) ×1 IMPLANT
DRSG OPSITE POSTOP 4X10 (GAUZE/BANDAGES/DRESSINGS) ×1 IMPLANT
DRSG PAD ABDOMINAL 8X10 ST (GAUZE/BANDAGES/DRESSINGS) ×1 IMPLANT
DURAPREP 26ML APPLICATOR (WOUND CARE) ×4 IMPLANT
GAUZE 4X4 16PLY ~~LOC~~+RFID DBL (SPONGE) ×2 IMPLANT
GAUZE SPONGE 4X4 12PLY STRL (GAUZE/BANDAGES/DRESSINGS) ×1 IMPLANT
GLOVE BIO SURGEON STRL SZ 6.5 (GLOVE) ×4 IMPLANT
GLOVE BIOGEL PI IND STRL 7.0 (GLOVE) ×9 IMPLANT
GLOVE BIOGEL PI INDICATOR 7.0 (GLOVE) ×4
GLOVE ECLIPSE 7.0 STRL STRAW (GLOVE) ×5 IMPLANT
GOWN STRL REUS W/ TWL LRG LVL3 (GOWN DISPOSABLE) ×6 IMPLANT
GOWN STRL REUS W/TWL LRG LVL3 (GOWN DISPOSABLE) ×6
HEMOSTAT SURGICEL 2X14 (HEMOSTASIS) ×1 IMPLANT
IRRIG SUCT STRYKERFLOW 2 WTIP (MISCELLANEOUS)
IRRIGATION SUCT STRKRFLW 2 WTP (MISCELLANEOUS) IMPLANT
KIT TURNOVER KIT B (KITS) ×4 IMPLANT
LIGASURE IMPACT 36 18CM CVD LR (INSTRUMENTS) ×1 IMPLANT
NEEDLE 22X1 1/2 (OR ONLY) (NEEDLE) ×2 IMPLANT
NS IRRIG 1000ML POUR BTL (IV SOLUTION) ×4 IMPLANT
PACK LAPAROSCOPY BASIN (CUSTOM PROCEDURE TRAY) ×4 IMPLANT
PACK TRENDGUARD 450 HYBRID PRO (MISCELLANEOUS) IMPLANT
POUCH SPECIMEN RETRIEVAL 10MM (ENDOMECHANICALS) IMPLANT
RETRACTOR WND ALEXIS 18 MED (MISCELLANEOUS) IMPLANT
RETRACTOR WND ALEXIS 25 LRG (MISCELLANEOUS) IMPLANT
RTRCTR WOUND ALEXIS 18CM MED (MISCELLANEOUS) ×3
RTRCTR WOUND ALEXIS 25CM LRG (MISCELLANEOUS) ×3
SET TUBE SMOKE EVAC HIGH FLOW (TUBING) ×4 IMPLANT
SHEARS HARMONIC ACE PLUS 36CM (ENDOMECHANICALS) IMPLANT
SLEEVE ENDOPATH XCEL 5M (ENDOMECHANICALS) ×4 IMPLANT
SPONGE T-LAP 18X18 ~~LOC~~+RFID (SPONGE) ×1 IMPLANT
SUT MNCRL AB 4-0 PS2 18 (SUTURE) ×4 IMPLANT
SUT VIC AB 0 CT1 18XCR BRD8 (SUTURE) IMPLANT
SUT VIC AB 0 CT1 27 (SUTURE) ×3
SUT VIC AB 0 CT1 27XBRD ANBCTR (SUTURE) IMPLANT
SUT VIC AB 0 CT1 8-18 (SUTURE) ×6
SUT VICRYL 0 UR6 27IN ABS (SUTURE) IMPLANT
SYR CONTROL 10ML LL (SYRINGE) ×1 IMPLANT
TOWEL GREEN STERILE FF (TOWEL DISPOSABLE) ×8 IMPLANT
TRAY FOLEY W/BAG SLVR 14FR (SET/KITS/TRAYS/PACK) IMPLANT
TRENDGUARD 450 HYBRID PRO PACK (MISCELLANEOUS)
TROCAR XCEL NON-BLD 11X100MML (ENDOMECHANICALS) IMPLANT
TROCAR XCEL NON-BLD 5MMX100MML (ENDOMECHANICALS) ×4 IMPLANT
VACURETTE 8MM F TIP (MISCELLANEOUS) ×1 IMPLANT

## 2021-07-29 NOTE — Anesthesia Procedure Notes (Signed)
Procedure Name: Intubation Date/Time: 07/29/2021 11:11 AM  Performed by: Caren Macadam, CRNAPre-anesthesia Checklist: Patient identified, Emergency Drugs available, Suction available and Patient being monitored Patient Re-evaluated:Patient Re-evaluated prior to induction Oxygen Delivery Method: Circle system utilized Preoxygenation: Pre-oxygenation with 100% oxygen Induction Type: IV induction Ventilation: Mask ventilation without difficulty Laryngoscope Size: Miller and 2 Grade View: Grade II Tube type: Oral Tube size: 7.0 mm Number of attempts: 1 Airway Equipment and Method: Stylet Placement Confirmation: ETT inserted through vocal cords under direct vision, positive ETCO2 and breath sounds checked- equal and bilateral Secured at: 22 cm Tube secured with: Tape Comments: Small amount of filing to left front incisor. Moser aware and assessed.

## 2021-07-29 NOTE — Op Note (Addendum)
PROCEDURE DATE:07/29/2021  PREOPERATIVE DIAGNOSIS:  Right cornual ectopic pregnancy POSTOPERATIVE DIAGNOSIS:  The same SURGEON:   Jaynie Collins, M.D. ASSISTANT:  Karmen Stabs, RNFA ANESTHESIOLOGY TEAM: Anesthesiologist: Val Eagle, MD CRNA: Caren Macadam, CRNA; Adair Laundry, CRNA OPERATION:  Exploratory laparotomy,  removal of right cornual region of uterus containing ectopic gestation, right salpingectomy, curettage of uterine cavity  ANESTHESIA:  General endotracheal.  INDICATIONS: The patient is a 41 y.o. [id number] with right cornual ectopic gestation who desired definitive surgical management. The risks, benefits, indications, and alternatives of the procedure were reviewed with the patient.  On the day of surgery, the risks of surgery were again discussed with the patient including but not limited to: bleeding which may require transfusion or reoperation; infection which may require antibiotics; injury to bowel, bladder, ureters or other surrounding organs; need for additional procedures; thromboembolic phenomenon, incisional problems and other postoperative/anesthesia complications. Written informed consent was obtained.    OPERATIVE FINDINGS:  A ten week size uterus with bulging right cornual region. Dilated right fallopian tube, involved in the cornual ectopic .  Normal left fallopian tube and bilateral ovaries.   No other anomalies noted in pelvis.  ESTIMATED BLOOD LOSS: 50 ml FLUIDS:  800 ml of Lactated Ringers URINE OUTPUT:  150 ml of clear yellow urine. SPECIMENS:  Right cornu and fallopian tube containing ectopic gestation COMPLICATIONS:  None immediate.  DESCRIPTION OF PROCEDURE:  The patient received intravenous antibiotics and had sequential compression devices applied to her lower extremities while in the preoperative area.   She was taken to the operating room and placed under general anesthesia without difficulty.The abdomen and perineum were prepped and draped in a  sterile manner, and she was placed in a dorsal supine position.  A Foley catheter was inserted into the bladder and attached to constant drainage. After an adequate timeout was performed, a mini Pfannensteil skin incision was made. This incision was taken down to the fascia using electrocautery with care given to maintain good hemostasis. The fascia was incised in the midline and the fascial incision was then extended bilaterally using electrocautery without difficulty. The fascia was then dissected off the underlying rectus muscles using blunt and sharp dissection. The rectus muscles were split bluntly in the midline and the peritoneum entered sharply without complication. This peritoneal incision was then extended superiorly and inferiorly with care given to prevent bowel or bladder injury.  Attention was then turned to the pelvis. The bowel was packed away with moist laparotomy sponges.   The small Alexis retractor was placed to help with visualization.  The right cornu was noted to be bulging, vasopressin solution was injected around the base of this.  The Ligasure device was used to do a right salpingectomy, separating the fallopian tube from the underlying mesosalpinx and cornual attachment.  Two Heaney clamps were used to clamp the area and it was resected, containing significant amount of tissue. More pregnancy tissue was noted in the endometrial cavity, an attempt was made to remove it using a metal curette but was still getting significant amounts.  An 8 mm suction curette was then introduced into cavity from the cornual incision, and suction was activated which help clear the remaining tissue. The endometrial cavity was irrigated and the fluid was aspirated.  The defect was closed in three layers using 0 Vicryl interrupted stitches for first two layers and 3-0 Vicryl for the serosal layer. Surgicel was placed over the uterine incision. Good hemostasis was noted.  All laparotomy sponges and  instruments  were removed from the abdomen. The peritoneum was closed with a 3-0 Vicryl running stitch, and the fascia was also closed in a running fashion with 0 Vicryl suture. The subcutaneous layer was irrigated and reapproximated with 3-0 Vicryl interrupted stiches.  The skin was closed with a 4-0 Monocryl subcuticular stitch. Sponge, lap, needle, and instrument counts were correct times three. The patient was taken to the recovery area awake, extubated and in stable condition.  Patient will be advised of need for postoperative methotrexate treatment to get any lingering cells, and for close postoperative surveillance.   She will also be re- advised that any future pregnancies will have to be delivered via cesarean section, this will occur around 36-[redacted] weeks gestation as future vaginal deliveries are now contraindicated.  Of note, patient was counseled about this prior to surgery, and was offered sterilization with removal of her left fallopian tube, buit she declined this.    Jaynie Collins, MD, FACOG Attending Obstetrician & Gynecologist Faculty Practice, [facility]

## 2021-07-29 NOTE — Transfer of Care (Signed)
Immediate Anesthesia Transfer of Care Note  Patient: Mikayla Lopez  Procedure(s) Performed: DIAGNOSTIC LAPAROTOMY WITH REMOVAL OF ECTOPIC PREGNANCY (Abdomen) OPEN RIGHT SALPINGECTOMY (Right: Abdomen) UTERINE CURETTAGE (Abdomen)  Patient Location: PACU  Anesthesia Type:General  Level of Consciousness: awake and alert   Airway & Oxygen Therapy: Patient Spontanous Breathing and Patient connected to face mask oxygen  Post-op Assessment: Report given to RN and Post -op Vital signs reviewed and stable  Post vital signs: Reviewed and stable  Last Vitals:  Vitals Value Taken Time  BP 99/40 07/29/21 1315  Temp    Pulse 52 07/29/21 1317  Resp 8 07/29/21 1317  SpO2 96 % 07/29/21 1317  Vitals shown include unvalidated device data.  Last Pain:  Vitals:   07/29/21 1036  TempSrc: Oral  PainSc: 3       Patients Stated Pain Goal: 3 (07/29/21 1036)  Complications: No notable events documented.

## 2021-07-29 NOTE — Interval H&P Note (Signed)
History and Physical Interval Note 07/29/2021 10:17 AM  Mikayla Lopez  has presented today for surgery, with the diagnosis of right cornual ectopic pregnancy.  The various methods of treatment have been discussed with the patient and family. After consideration of risks, benefits and other options for treatment, the patient has consented to  LAPAROTOMY, RESECTION OF RIGHT CORNUAL ECTOPIC PREGNANCY AND RIGHT FALLOPIAN TUBE, POSSIBLE RESECTION OF LEFT FALLOPIAN TUBE as a surgical intervention.  The patient's history has been reviewed, patient examined, no change in status, stable for surgery.  I have reviewed the patient's chart and labs.  Questions were answered to the patient's satisfaction.  Discussed need for postoperative methotrexate treatment, and for serial HCG checks to make sure HCG levels are decreasing. Explained that she will go home tomorrow morning if she remains stable.   Of note, the risks of surgery discussed with the patient included but not limited to: bleeding which may require transfusion or reoperation; infection which may require prolonged hospitalization or re-hospitalization and antibiotic therapy; injury to bowel, bladder, ureters and major vessels or other surrounding organs which may lead to other procedures; formation of adhesions; need for additional procedures including laparotomy or subsequent procedures secondary to intraoperative injury or abnormal pathology; thromboembolic phenomenon; incisional problems and other postoperative or anesthesia complications.  Patient has been NPO since last night and she will remain NPO for procedure.  Anesthesia and OR aware.  Preoperative prophylactic antibiotics and SCDs ordered on call to the OR.  To OR when ready.   Jaynie Collins, MD, FACOG Obstetrician & Gynecologist, [facility] for [employer], [facility]

## 2021-07-29 NOTE — H&P (Signed)
Mikayla Lopez is an 41 y.o. female. [id number] Patient's last menstrual period was 05/24/2021 (approximate). [redacted]w[redacted]d Patient has had some pelvic pain for 2 weeks which got worse 07/28/21 and has noted light to moderate vaginal bleeding for 4-5 days.   Pertinent Gynecological History: Menses: regular every 28 days without intermenstrual spotting Contraception: none Blood transfusions: none Sexually transmitted diseases: no past history Previous GYN Procedures: DNC  OB History: [id number]    Menstrual History:  Patient's last menstrual period was 05/24/2021 (approximate).    Past Medical History:  Diagnosis Date   Asthma     Past Surgical History:  Procedure Laterality Date   ADENOIDECTOMY      History reviewed. No pertinent family history.  Social History:  reports that she has never smoked. She has never used smokeless tobacco. She reports that she does not currently use alcohol. She reports current drug use. Drug: Marijuana.  Allergies: No Known Allergies  Medications Prior to Admission  Medication Sig Dispense Refill Last Dose   FLUoxetine (PROZAC) 10 MG tablet Take 1 tablet (10 mg total) by mouth daily. 21 tablet 3    mupirocin ointment (BACTROBAN) 2 % Apply 1 application topically 3 (three) times daily. 30 g 1    PROVENTIL HFA 108 (90 Base) MCG/ACT inhaler 2 PUFF EVERY 4 HOURS AS NEEDED INHALATION  1    traMADol (ULTRAM) 50 MG tablet Take 1 tablet (50 mg total) by mouth every 6 (six) hours as needed. 15 tablet 0     Review of Systems  Constitutional:  Positive for appetite change.  Gastrointestinal:  Positive for abdominal pain.  Genitourinary:  Positive for pelvic pain and vaginal bleeding.    Blood pressure (!) 118/53, pulse (!) 57, temperature 98.8 F (37.1 C), temperature source Oral, resp. rate 18, height 5\' 3"  (1.6 m), weight 99.6 kg, last menstrual period 05/24/2021, SpO2 100 %, unknown if currently breastfeeding. Physical Exam Vitals and nursing note reviewed.   Constitutional:      Appearance: She is ill-appearing.  Cardiovascular:     Rate and Rhythm: Normal rate.     Heart sounds: Normal heart sounds.  Pulmonary:     Effort: Pulmonary effort is normal.  Abdominal:     General: Abdomen is flat.     Palpations: Abdomen is soft.     Tenderness: There is abdominal tenderness in the right lower quadrant and left lower quadrant.  Skin:    General: Skin is warm and dry.  Neurological:     General: No focal deficit present.     Mental Status: She is alert.  Psychiatric:        Mood and Affect: Mood normal.        Behavior: Behavior normal.     Results for orders placed or performed during the hospital encounter of 07/28/21 (from the past 24 hour(s))  Urinalysis, Routine w reflex microscopic     Status: Abnormal   Collection Time: 07/28/21 11:28 PM  Result Value Ref Range   Color, Urine YELLOW YELLOW   APPearance CLEAR CLEAR   Specific Gravity, Urine 1.014 1.005 - 1.030   pH 6.0 5.0 - 8.0   Glucose, UA NEGATIVE NEGATIVE mg/dL   Hgb urine dipstick LARGE (A) NEGATIVE   Bilirubin Urine NEGATIVE NEGATIVE   Ketones, ur NEGATIVE NEGATIVE mg/dL   Protein, ur NEGATIVE NEGATIVE mg/dL   Nitrite NEGATIVE NEGATIVE   Leukocytes,Ua NEGATIVE NEGATIVE   RBC / HPF >50 (H) 0 - 5 RBC/hpf   WBC, UA 0-5 0 -  5 WBC/hpf   Bacteria, UA RARE (A) NONE SEEN   Squamous Epithelial / LPF 0-5 0 - 5   Mucus PRESENT   hCG, quantitative, pregnancy     Status: Abnormal   Collection Time: 07/29/21 12:05 AM  Result Value Ref Range   hCG, Beta Chain, Quant, S 38,978 (H) <5 mIU/mL  CBC     Status: Abnormal   Collection Time: 07/29/21 12:05 AM  Result Value Ref Range   WBC 6.5 4.0 - 10.5 K/uL   RBC 3.94 3.87 - 5.11 MIL/uL   Hemoglobin 10.6 (L) 12.0 - 15.0 g/dL   HCT 33.1 (L) 36.0 - 46.0 %   MCV 84.0 80.0 - 100.0 fL   MCH 26.9 26.0 - 34.0 pg   MCHC 32.0 30.0 - 36.0 g/dL   RDW 14.5 11.5 - 15.5 %   Platelets 302 150 - 400 K/uL   nRBC 0.0 0.0 - 0.2 %   Comprehensive metabolic panel     Status: Abnormal   Collection Time: 07/29/21 12:05 AM  Result Value Ref Range   Sodium 136 135 - 145 mmol/L   Potassium 3.8 3.5 - 5.1 mmol/L   Chloride 104 98 - 111 mmol/L   CO2 25 22 - 32 mmol/L   Glucose, Bld 97 70 - 99 mg/dL   BUN 9 6 - 20 mg/dL   Creatinine, Ser 0.68 0.44 - 1.00 mg/dL   Calcium 9.0 8.9 - 10.3 mg/dL   Total Protein 6.4 (L) 6.5 - 8.1 g/dL   Albumin 3.2 (L) 3.5 - 5.0 g/dL   AST 11 (L) 15 - 41 U/L   ALT 12 0 - 44 U/L   Alkaline Phosphatase 45 38 - 126 U/L   Total Bilirubin 0.2 (L) 0.3 - 1.2 mg/dL   GFR, Estimated >60 >60 mL/min   Anion gap 7 5 - 15  Wet prep, genital     Status: Abnormal   Collection Time: 07/29/21 12:23 AM  Result Value Ref Range   Yeast Wet Prep HPF POC NONE SEEN NONE SEEN   Trich, Wet Prep NONE SEEN NONE SEEN   Clue Cells Wet Prep HPF POC PRESENT (A) NONE SEEN   WBC, Wet Prep HPF POC <10 <10   Sperm NONE SEEN     US OB LESS THAN 14 WEEKS WITH OB TRANSVAGINAL  Addendum Date: 07/29/2021   ADDENDUM REPORT: 07/29/2021 02:12 ADDENDUM: After continued evaluation of the images, and subsequent discussion with Dr. Jimmye Norman, the findings described above represent sequelae associated with a coronal ectopic pregnancy. Electronically Signed   By: Virgina Norfolk M.D.   On: 07/29/2021 02:12   Result Date: 07/29/2021 CLINICAL DATA:  Pain x2 weeks with heavy vaginal bleeding x4 days. EXAM: OBSTETRIC <14 WK [place] AND TRANSVAGINAL OB US TECHNIQUE: Both transabdominal and transvaginal ultrasound examinations were performed for complete evaluation of the gestation as well as the maternal uterus, adnexal regions, and pelvic cul-de-sac. Transvaginal technique was performed to assess early pregnancy. COMPARISON:  None Available. FINDINGS: Intrauterine gestational sac: None Yolk sac:  Not Visualized. Embryo:  Not Visualized. Cardiac Activity: Not Visualized. Heart Rate: N/A  bpm Maternal uterus/adnexae: A 2.9 cm x 2.6 cm x 2.6 cm  heterogeneous uterine fibroid is noted. A 3.4 cm x 3.2 cm x 3.4 cm vascular area of heterogeneous echogenicity is seen within the uterus on the right. This is along an area of intense pain during the examination, as per the ultrasound technologist. The bilateral ovaries are visualized and are normal in  appearance. No pelvic free fluid is seen. IMPRESSION: 1. Findings, as described above, which may represent sequelae associated with a cornual ectopic pregnancy. Correlation with follow-up pelvic ultrasound and serial beta HCG levels is recommended. Electronically Signed: By: Aram Candela M.D. On: 07/29/2021 01:50    Assessment/Plan: Suspected right cornual ectopic pregnancy no evidence of rupture on [place]. Observation, NPO and consideration for exploratory surgery and possible removal of ectopic pregnancy.  Scheryl Darter 07/29/2021, 2:44 AM

## 2021-07-30 ENCOUNTER — Encounter (HOSPITAL_COMMUNITY): Payer: Self-pay | Admitting: Obstetrics & Gynecology

## 2021-07-30 LAB — COMPREHENSIVE METABOLIC PANEL
ALT: 14 U/L (ref 0–44)
AST: 17 U/L (ref 15–41)
Albumin: 2.9 g/dL — ABNORMAL LOW (ref 3.5–5.0)
Alkaline Phosphatase: 44 U/L (ref 38–126)
Anion gap: 9 (ref 5–15)
BUN: 11 mg/dL (ref 6–20)
CO2: 26 mmol/L (ref 22–32)
Calcium: 8.5 mg/dL — ABNORMAL LOW (ref 8.9–10.3)
Chloride: 102 mmol/L (ref 98–111)
Creatinine, Ser: 0.82 mg/dL (ref 0.44–1.00)
GFR, Estimated: >60 mL/min (ref >60)
Glucose, Bld: 100 mg/dL — ABNORMAL HIGH (ref 70–99)
Potassium: 3.7 mmol/L (ref 3.5–5.1)
Sodium: 137 mmol/L (ref 135–145)
Total Bilirubin: 0.6 mg/dL (ref 0.3–1.2)
Total Protein: 6.1 g/dL — ABNORMAL LOW (ref 6.5–8.1)

## 2021-07-30 LAB — GC/CHLAMYDIA PROBE AMP (~~LOC~~) NOT AT ARMC
Chlamydia: NEGATIVE
Comment: NEGATIVE
Comment: NORMAL
Neisseria Gonorrhea: NEGATIVE

## 2021-07-30 LAB — CBC
HCT: 31.6 % — ABNORMAL LOW (ref 36.0–46.0)
HCT: 32.1 % — ABNORMAL LOW (ref 36.0–46.0)
Hemoglobin: 10 g/dL — ABNORMAL LOW (ref 12.0–15.0)
Hemoglobin: 10 g/dL — ABNORMAL LOW (ref 12.0–15.0)
MCH: 26.8 pg (ref 26.0–34.0)
MCH: 26.6 pg (ref 26.0–34.0)
MCHC: 31.6 g/dL (ref 30.0–36.0)
MCHC: 31.2 g/dL (ref 30.0–36.0)
MCV: 84.7 fL (ref 80.0–100.0)
MCV: 85.4 fL (ref 80.0–100.0)
Platelets: 291 10*3/uL (ref 150–400)
Platelets: 279 10*3/uL (ref 150–400)
RBC: 3.73 MIL/uL — ABNORMAL LOW (ref 3.87–5.11)
RBC: 3.76 MIL/uL — ABNORMAL LOW (ref 3.87–5.11)
RDW: 14.4 % (ref 11.5–15.5)
RDW: 14.5 % (ref 11.5–15.5)
WBC: 12.2 10*3/uL — ABNORMAL HIGH (ref 4.0–10.5)
WBC: 9.6 10*3/uL (ref 4.0–10.5)
nRBC: 0 % (ref 0.0–0.2)
nRBC: 0 % (ref 0.0–0.2)

## 2021-07-30 MED ORDER — CYCLOBENZAPRINE HCL 10 MG PO TABS
10.0000 mg | ORAL_TABLET | Freq: Three times a day (TID) | ORAL | Status: DC | PRN
  Administered 2021-07-31 (×2): 10 mg via ORAL
  Filled 2021-07-30 (×2): qty 1

## 2021-07-30 MED ORDER — LACTATED RINGERS IV BOLUS
1000.0000 mL | Freq: Once | INTRAVENOUS | Status: DC
Start: 2021-07-30 — End: 2021-08-01

## 2021-07-30 NOTE — Anesthesia Postprocedure Evaluation (Signed)
Anesthesia Post Note  Patient: Mikayla Lopez  Procedure(s) Performed: DIAGNOSTIC LAPAROTOMY WITH REMOVAL OF ECTOPIC PREGNANCY (Abdomen) OPEN RIGHT SALPINGECTOMY (Right: Abdomen) UTERINE CURETTAGE (Abdomen)     Patient location during evaluation: PACU Anesthesia Type: General Level of consciousness: awake and alert Pain management: pain level controlled Vital Signs Assessment: post-procedure vital signs reviewed and stable Respiratory status: spontaneous breathing, nonlabored ventilation and respiratory function stable Cardiovascular status: blood pressure returned to baseline and stable Postop Assessment: no apparent nausea or vomiting Anesthetic complications: no   No notable events documented.  Last Vitals:  Vitals:   07/30/21 1359 07/30/21 1400  BP: 109/70   Pulse: 63   Resp:    Temp: 36.9 C   SpO2:  99%    Last Pain:  Vitals:   07/30/21 1359  TempSrc: Oral  PainSc:                  Philena Obey

## 2021-07-30 NOTE — Progress Notes (Addendum)
    Faculty Practice OB/GYN Attending Note  Subjective:  Patient reports some incisional pain, but improved from yesterday. Has not gotten out of bed much. Not voided yet after foley removal this morning. Has not had any food, was sleepy yesterday. No N/V. No flatus yet.   Admitted on 07/28/2021 for Cornual pregnancy.    Objective:  Blood pressure 107/66, pulse 64, temperature 97.9 F (36.6 C), temperature source Oral, resp. rate 16, height 5\' 3"  (1.6 m), weight 99.3 kg, last menstrual period 05/24/2021, SpO2 99 %, unknown if currently breastfeeding. Gen: NAD HENT: Normocephalic, atraumatic Lungs: Normal respiratory effort Heart: Regular rate noted Abdomen: soft, mild incisional tenderness, honeycomb dressing C/D/I Cervix: Deferred Ext: 2+ DTRs, no edema, no cyanosis, negative Homan's sign     Latest Ref Rng & Units 07/30/2021    4:51 AM 07/29/2021   12:05 AM 05/06/2017    9:36 PM  CBC  WBC 4.0 - 10.5 K/uL 12.2  6.5  15.6   Hemoglobin 12.0 - 15.0 g/dL 05/08/2017  26.3  33.5   Hematocrit 36.0 - 46.0 % 31.6  33.1  32.3   Platelets 150 - 400 K/uL 291  302  181     Assessment & Plan:  41 y.o. 24 POD#1 s/p ELAP, removal of right cornual region of uterus containing ectopic gestation.  Received one dose of MTX 50 mg /m2 IM postoperatively.  Doing well overall.  Recommended OOB, regular diet, analgesia as needed. Awaiting spontaneous void. Possible discharge later today or tomorrow morning. Continue close inpatient observation for now.   [id number], MD, FACOG Obstetrician & Gynecologist, [facility] for [facility]

## 2021-07-30 NOTE — Progress Notes (Addendum)
Pt unable to void post foley removal. In and out cath performed per Dr. Mont Dutton order. 250 cc clear yellow urine returned. Will continue to monitor. Carmelina Dane, RN

## 2021-07-30 NOTE — Progress Notes (Signed)
Patient still without spontaneous void. Had adequate UOP overnight, last straight cath yielded about 250 mL which was adequate. It has been over six hours, no spontaneous void reported. She is eating and drinking well, ambulating.  Will recheck labs, given 1L bolus of fluids.  If no spontaneous void by around 2100, will scan/bladder and/or straight cath again.  Of note, there was no bladder manipulation during surgery and foley placement was atraumatic. Unsure of etiology of urinary retention at this point.   Will continue to observe closely.   Jaynie Collins, MD, FACOG Obstetrician & Gynecologist, [facility] for [employer], [facility]

## 2021-07-30 NOTE — Anesthesia Preprocedure Evaluation (Signed)
Anesthesia Evaluation  Patient identified by MRN, date of birth, ID band Patient awake    Reviewed: Allergy & Precautions, H&P , NPO status , Patient's Chart, lab work & pertinent test results, reviewed documented beta blocker date and time   History of Anesthesia Complications Negative for: history of anesthetic complications  Airway Mallampati: II  TM Distance: >3 FB Neck ROM: full    Dental  (+) Dental Advisory Given   Pulmonary asthma ,    breath sounds clear to auscultation       Cardiovascular negative cardio ROS   Rhythm:regular     Neuro/Psych negative neurological ROS  negative psych ROS   GI/Hepatic negative GI ROS, Neg liver ROS,   Endo/Other  negative endocrine ROS  Renal/GU negative Renal ROS     Musculoskeletal   Abdominal   Peds  Hematology  (+) Blood dyscrasia, anemia ,   Anesthesia Other Findings   Reproductive/Obstetrics (+) Pregnancy ectopic pregnancy                             Anesthesia Physical Anesthesia Plan  ASA: 2  Anesthesia Plan: General   Post-op Pain Management: Toradol IV (intra-op)*   Induction: Intravenous  PONV Risk Score and Plan: 3 and Ondansetron and Dexamethasone  Airway Management Planned: Oral ETT  Additional Equipment: None  Intra-op Plan:   Post-operative Plan: Extubation in OR  Informed Consent: I have reviewed the patients History and Physical, chart, labs and discussed the procedure including the risks, benefits and alternatives for the proposed anesthesia with the patient or authorized representative who has indicated his/her understanding and acceptance.     Dental advisory given  Plan Discussed with: CRNA  Anesthesia Plan Comments:         Anesthesia Quick Evaluation

## 2021-07-31 MED ORDER — OXYCODONE-ACETAMINOPHEN 5-325 MG PO TABS: 1.0000 | ORAL_TABLET | Freq: Four times a day (QID) | ORAL | 0 refills | Status: AC | PRN

## 2021-07-31 MED ORDER — IBUPROFEN 600 MG PO TABS: 600.0000 mg | ORAL_TABLET | Freq: Four times a day (QID) | ORAL | 0 refills | Status: AC

## 2021-07-31 NOTE — Discharge Summary (Signed)
Physician Discharge Summary  Patient ID: Mikayla Lopez MRN: [id number] DOB/AGE: July 29, 1980 41 y.o.  Admit date: 07/28/2021 Discharge date: No discharge date for patient encounter.    Admission Diagnoses:  Principal Problem:   Cornual pregnancy Active Problems:   S/P cornual ectopic pregnancy surgery   Discharge Diagnoses:  Same  Past Medical History:  Diagnosis Date   Asthma    Gestational hypertension 05/05/2017   resolved after delivery   Hereditary disease in family possibly affecting fetus, fetus 1     Surgeries: Procedure(s): DIAGNOSTIC LAPAROTOMY WITH REMOVAL OF ECTOPIC PREGNANCY OPEN RIGHT SALPINGECTOMY UTERINE CURETTAGE on 07/29/2021   Consultants: None  Discharged Condition: Improved  Hospital Course: Mikayla Lopez is an 41 y.o. female [id number] who was admitted 07/28/2021 with a chief complaint of  Chief Complaint  Patient presents with   Abdominal Pain   Vaginal Bleeding  , and found to have a diagnosis of Cornual pregnancy.  They were brought to the operating room on 07/29/2021 and underwent the above named procedures.    She was given perioperative antibiotics:  Anti-infectives (From admission, onward)    Start     Dose/Rate Route Frequency Ordered Stop   07/29/21 1100  ceFAZolin (ANCEF) IVPB 2g/100 mL premix        2 g 200 mL/hr over 30 Minutes Intravenous STAT 07/29/21 1045 07/29/21 1122     .   She was given sequential compression devices, early ambulation, and chemoprophylaxis for DVT prophylaxis.  She benefited maximally from their hospital stay and there were no complications. She was ambulating, voiding, tolerating po and deemed stable for discharge.  Recent vital signs:  Vitals:   07/31/21 0632 07/31/21 0641  BP: (!) 95/45 (!) 98/59  Pulse: (!) 53 (!) 58  Resp: 20   Temp: 98.4 F (36.9 C)   SpO2: 100%    Discharge exam: Physical Examination: General appearance - alert, well appearing, and in no distress Chest - normal effort Heart -  normal rate and regular rhythm Abdomen - soft, appropriately tender, dressing is clean and dry Extremities - peripheral pulses normal, no pedal edema, no clubbing or cyanosis, Homan's sign negative bilaterally Recent laboratory studies:  Results for orders placed or performed during the hospital encounter of 07/28/21  Wet prep, genital  Result Value Ref Range   Yeast Wet Prep HPF POC NONE SEEN NONE SEEN   Trich, Wet Prep NONE SEEN NONE SEEN   Clue Cells Wet Prep HPF POC PRESENT (A) NONE SEEN   WBC, Wet Prep HPF POC <10 <10   Sperm NONE SEEN   Urinalysis, Routine w reflex microscopic  Result Value Ref Range   Color, Urine YELLOW YELLOW   APPearance CLEAR CLEAR   Specific Gravity, Urine 1.014 1.005 - 1.030   pH 6.0 5.0 - 8.0   Glucose, UA NEGATIVE NEGATIVE mg/dL   Hgb urine dipstick LARGE (A) NEGATIVE   Bilirubin Urine NEGATIVE NEGATIVE   Ketones, ur NEGATIVE NEGATIVE mg/dL   Protein, ur NEGATIVE NEGATIVE mg/dL   Nitrite NEGATIVE NEGATIVE   Leukocytes,Ua NEGATIVE NEGATIVE   RBC / HPF >50 (H) 0 - 5 RBC/hpf   WBC, UA 0-5 0 - 5 WBC/hpf   Bacteria, UA RARE (A) NONE SEEN   Squamous Epithelial / LPF 0-5 0 - 5   Mucus PRESENT   hCG, quantitative, pregnancy  Result Value Ref Range   hCG, Beta Chain, Quant, S 38,978 (H) <5 mIU/mL  CBC  Result Value Ref Range   WBC 6.5 4.0 -  10.5 K/uL   RBC 3.94 3.87 - 5.11 MIL/uL   Hemoglobin 10.6 (L) 12.0 - 15.0 g/dL   HCT 22.9 (L) 79.8 - 92.1 %   MCV 84.0 80.0 - 100.0 fL   MCH 26.9 26.0 - 34.0 pg   MCHC 32.0 30.0 - 36.0 g/dL   RDW 19.4 17.4 - 08.1 %   Platelets 302 150 - 400 K/uL   nRBC 0.0 0.0 - 0.2 %  Comprehensive metabolic panel  Result Value Ref Range   Sodium 136 135 - 145 mmol/L   Potassium 3.8 3.5 - 5.1 mmol/L   Chloride 104 98 - 111 mmol/L   CO2 25 22 - 32 mmol/L   Glucose, Bld 97 70 - 99 mg/dL   BUN 9 6 - 20 mg/dL   Creatinine, Ser 4.48 0.44 - 1.00 mg/dL   Calcium 9.0 8.9 - 18.5 mg/dL   Total Protein 6.4 (L) 6.5 - 8.1 g/dL    Albumin 3.2 (L) 3.5 - 5.0 g/dL   AST 11 (L) 15 - 41 U/L   ALT 12 0 - 44 U/L   Alkaline Phosphatase 45 38 - 126 U/L   Total Bilirubin 0.2 (L) 0.3 - 1.2 mg/dL   GFR, Estimated >63 >14 mL/min   Anion gap 7 5 - 15  CBC  Result Value Ref Range   WBC 12.2 (H) 4.0 - 10.5 K/uL   RBC 3.73 (L) 3.87 - 5.11 MIL/uL   Hemoglobin 10.0 (L) 12.0 - 15.0 g/dL   HCT 97.0 (L) 26.3 - 78.5 %   MCV 84.7 80.0 - 100.0 fL   MCH 26.8 26.0 - 34.0 pg   MCHC 31.6 30.0 - 36.0 g/dL   RDW 88.5 02.7 - 74.1 %   Platelets 291 150 - 400 K/uL   nRBC 0.0 0.0 - 0.2 %  CBC  Result Value Ref Range   WBC 9.6 4.0 - 10.5 K/uL   RBC 3.76 (L) 3.87 - 5.11 MIL/uL   Hemoglobin 10.0 (L) 12.0 - 15.0 g/dL   HCT 28.7 (L) 86.7 - 67.2 %   MCV 85.4 80.0 - 100.0 fL   MCH 26.6 26.0 - 34.0 pg   MCHC 31.2 30.0 - 36.0 g/dL   RDW 09.4 70.9 - 62.8 %   Platelets 279 150 - 400 K/uL   nRBC 0.0 0.0 - 0.2 %  Comprehensive metabolic panel  Result Value Ref Range   Sodium 137 135 - 145 mmol/L   Potassium 3.7 3.5 - 5.1 mmol/L   Chloride 102 98 - 111 mmol/L   CO2 26 22 - 32 mmol/L   Glucose, Bld 100 (H) 70 - 99 mg/dL   BUN 11 6 - 20 mg/dL   Creatinine, Ser 3.66 0.44 - 1.00 mg/dL   Calcium 8.5 (L) 8.9 - 10.3 mg/dL   Total Protein 6.1 (L) 6.5 - 8.1 g/dL   Albumin 2.9 (L) 3.5 - 5.0 g/dL   AST 17 15 - 41 U/L   ALT 14 0 - 44 U/L   Alkaline Phosphatase 44 38 - 126 U/L   Total Bilirubin 0.6 0.3 - 1.2 mg/dL   GFR, Estimated >29 >47 mL/min   Anion gap 9 5 - 15  Pregnancy, urine POC  Result Value Ref Range   Preg Test, Ur POSITIVE (A) NEGATIVE  Type and screen [facility]  Result Value Ref Range   ABO/RH(D) A POS    Antibody Screen NEG    Sample Expiration      08/01/2021,2359  Performed at [facility], [address], [place], [place] [address]   GC/Chlamydia probe amp ([place])not at [facility]  Result Value Ref Range   Neisseria Gonorrhea Negative    Chlamydia Negative    Comment Normal Reference Ranger  Chlamydia - Negative    Comment      Normal Reference Range Neisseria Gonorrhea - Negative    Discharge Medications:   Allergies as of 07/31/2021   No Known Allergies      Medication List     STOP taking these medications    traMADol 50 MG tablet Commonly known as: ULTRAM       TAKE these medications    FLUoxetine 10 MG tablet Commonly known as: PROZAC Take 1 tablet (10 mg total) by mouth daily.   ibuprofen 600 MG tablet Commonly known as: ADVIL Take 1 tablet (600 mg total) by mouth every 6 (six) hours.   mupirocin ointment 2 % Commonly known as: BACTROBAN Apply 1 application topically 3 (three) times daily.   oxyCODONE-acetaminophen 5-325 MG tablet Commonly known as: PERCOCET/ROXICET Take 1-2 tablets by mouth every 6 (six) hours as needed.   Proventil HFA 108 (90 Base) MCG/ACT inhaler Generic drug: albuterol 2 PUFF EVERY 4 HOURS AS NEEDED INHALATION        Diagnostic Studies: US OB LESS THAN 14 WEEKS WITH OB TRANSVAGINAL  Addendum Date: 07/29/2021   ADDENDUM REPORT: 07/29/2021 02:12 ADDENDUM: After continued evaluation of the images, and subsequent discussion with Dr. Mayford Knife, the findings described above represent sequelae associated with a coronal ectopic pregnancy. Electronically Signed   By: Aram Candela M.D.   On: 07/29/2021 02:12   Result Date: 07/29/2021 CLINICAL DATA:  Pain x2 weeks with heavy vaginal bleeding x4 days. EXAM: OBSTETRIC <14 WK [place] AND TRANSVAGINAL OB US TECHNIQUE: Both transabdominal and transvaginal ultrasound examinations were performed for complete evaluation of the gestation as well as the maternal uterus, adnexal regions, and pelvic cul-de-sac. Transvaginal technique was performed to assess early pregnancy. COMPARISON:  None Available. FINDINGS: Intrauterine gestational sac: None Yolk sac:  Not Visualized. Embryo:  Not Visualized. Cardiac Activity: Not Visualized. Heart Rate: N/A  bpm Maternal uterus/adnexae: A 2.9 cm x 2.6 cm x 2.6 cm  heterogeneous uterine fibroid is noted. A 3.4 cm x 3.2 cm x 3.4 cm vascular area of heterogeneous echogenicity is seen within the uterus on the right. This is along an area of intense pain during the examination, as per the ultrasound technologist. The bilateral ovaries are visualized and are normal in appearance. No pelvic free fluid is seen. IMPRESSION: 1. Findings, as described above, which may represent sequelae associated with a cornual ectopic pregnancy. Correlation with follow-up pelvic ultrasound and serial beta HCG levels is recommended. Electronically Signed: By: Aram Candela M.D. On: 07/29/2021 01:50    Disposition: Discharge disposition: 01-Home or Self Care       Discharge Instructions      Remove dressing in 72 hours   Complete by: As directed    Call MD for:  persistant nausea and vomiting   Complete by: As directed    Call MD for:  redness, tenderness, or signs of infection (pain, swelling, redness, odor or green/yellow discharge around incision site)   Complete by: As directed    Call MD for:  severe uncontrolled pain   Complete by: As directed    Call MD for:  temperature >100.4   Complete by: As directed    Diet - low sodium heart healthy  Complete by: As directed    Driving Restrictions   Complete by: As directed    None while taking narcotic pain meds   Increase activity slowly   Complete by: As directed    Lifting restrictions   Complete by: As directed    Nothing > 20 lbs x 6 weeks   Sexual Activity Restrictions   Complete by: As directed    None x 2 weeks        Follow-up Information     [facility] Follow up in 2 week(s).   Specialty: Obstetrics and Gynecology Why: postop check Contact information: [address] [place] [address] [phone]                 Signed: Reva Bores 07/31/2021, 8:00 AM

## 2021-07-31 NOTE — Progress Notes (Signed)
   07/30/21 2004  What Happened  Was fall witnessed? No  Was patient injured? No  Patient found on floor  Found by Staff-comment  Stated prior activity bathroom-unassisted  Follow Up  MD notified Jaynie Collins, MD  Time MD notified 2021  Additional tests No  Adult Fall Risk Assessment  Risk Factor Category (scoring not indicated) Not Applicable  Age 41  Fall History: Fall within 6 months prior to admission 0  Elimination; Bowel and/or Urine Incontinence 0  Elimination; Bowel and/or Urine Urgency/Frequency 0  Medications: includes PCA/Opiates, Anti-convulsants, Anti-hypertensives, Diuretics, Hypnotics, Laxatives, Sedatives, and Psychotropics 3  Patient Care Equipment 0  Mobility-Assistance 0  Mobility-Gait 0  Mobility-Sensory Deficit 0  Altered awareness of immediate physical environment 0  Impulsiveness 0  Lack of understanding of one's physical/cognitive limitations 0  Total Score 3  Patient Fall Risk Level High fall risk  Adult Fall Risk Interventions  Required Bundle Interventions *See Row Information* High fall risk - low, moderate, and high requirements implemented  Additional Interventions Use of appropriate toileting equipment (bedpan, BSC, etc.) (BSC)  Screening for Fall Injury Risk (To be completed on HIGH fall risk patients) - Assessing Need for Floor Mats  Risk For Fall Injury- Criteria for Floor Mats None identified - No additional interventions needed  Vitals  Temp 98.4 F (36.9 C)  Temp Source Oral  BP 107/60  BP Location Right Arm  BP Method Automatic  Patient Position (if appropriate) Lying  Pulse Rate (!) 59  Pulse Rate Source Monitor  Resp 20  Oxygen Therapy  SpO2 100 %  O2 Device Room Air  Neurological  Neuro (WDL) WDL  Level of Consciousness Alert  Orientation Level Oriented X4  Musculoskeletal  Musculoskeletal (WDL) X  Assistive Device BSC  Generalized Weakness Yes  Integumentary  Integumentary (WDL) X  Skin Color Appropriate for ethnicity   Skin Condition Dry  Skin Integrity Other (Comment) (surgical incision)  Skin Turgor Non-tenting

## 2021-08-02 ENCOUNTER — Other Ambulatory Visit: Payer: Self-pay

## 2021-08-02 DIAGNOSIS — Z8759 Personal history of other complications of pregnancy, childbirth and the puerperium: Secondary | ICD-10-CM

## 2021-08-02 LAB — SURGICAL PATHOLOGY

## 2021-08-03 ENCOUNTER — Other Ambulatory Visit: Payer: Medicaid Other

## 2021-08-03 ENCOUNTER — Other Ambulatory Visit: Payer: Self-pay

## 2021-08-03 DIAGNOSIS — Z8759 Personal history of other complications of pregnancy, childbirth and the puerperium: Secondary | ICD-10-CM

## 2021-08-04 ENCOUNTER — Telehealth: Payer: Self-pay

## 2021-08-04 DIAGNOSIS — Z8759 Personal history of other complications of pregnancy, childbirth and the puerperium: Secondary | ICD-10-CM

## 2021-08-04 LAB — BETA HCG QUANT (REF LAB): hCG Quant: 1196 m[IU]/mL

## 2021-08-04 NOTE — Addendum Note (Signed)
Addended by: Faythe Casa on: 08/04/2021 05:33 PM   Modules accepted: Orders

## 2021-08-04 NOTE — Telephone Encounter (Signed)
Pt returned call and I informed her of the results and that she will be able to come on 08/10/21 @ 1530 for non stat beta.    Leonette Nutting  08/04/21

## 2021-08-04 NOTE — Telephone Encounter (Addendum)
-----   Message from Tereso Newcomer, MD sent at 08/04/2021  3:58 AM EDT ----- Decreased HCG, continue weekly HCG draws until negative.  Please call to inform patient of results and recommendations.  Left message that I am calling in regards to results and f/u please give the office a call back.    Leonette Nutting  08/04/21

## 2021-08-10 ENCOUNTER — Other Ambulatory Visit: Payer: Medicaid Other

## 2021-08-12 ENCOUNTER — Encounter: Payer: Medicaid Other | Admitting: Obstetrics & Gynecology

## 2022-04-19 ENCOUNTER — Ambulatory Visit: Payer: Medicaid Other | Admitting: Podiatry

## 2022-11-25 ENCOUNTER — Ambulatory Visit (HOSPITAL_COMMUNITY)
Admission: EM | Admit: 2022-11-25 | Discharge: 2022-11-25 | Disposition: A | Discharge status: Home / Self Care | Payer: Medicaid Other | Attending: Emergency Medicine | Admitting: Emergency Medicine

## 2022-11-25 ENCOUNTER — Encounter (HOSPITAL_COMMUNITY): Payer: Self-pay

## 2022-11-25 DIAGNOSIS — R300 Dysuria: Secondary | ICD-10-CM | POA: Diagnosis present

## 2022-11-25 DIAGNOSIS — N760 Acute vaginitis: Secondary | ICD-10-CM | POA: Diagnosis present

## 2022-11-25 DIAGNOSIS — Z202 Contact with and (suspected) exposure to infections with a predominantly sexual mode of transmission: Secondary | ICD-10-CM

## 2022-11-25 LAB — POCT URINALYSIS DIP (MANUAL ENTRY)
Bilirubin, UA: NEGATIVE
Glucose, UA: NEGATIVE mg/dL
Ketones, POC UA: NEGATIVE mg/dL
Leukocytes, UA: NEGATIVE
Nitrite, UA: NEGATIVE
Protein Ur, POC: NEGATIVE mg/dL
Spec Grav, UA: 1.02 (ref 1.010–1.025)
Urobilinogen, UA: 0.2 U/dL
pH, UA: 7.5 (ref 5.0–8.0)

## 2022-11-25 MED ORDER — AZITHROMYCIN 250 MG PO TABS
ORAL_TABLET | ORAL | Status: AC
  Filled 2022-11-25: qty 4

## 2022-11-25 MED ORDER — AZITHROMYCIN 250 MG PO TABS
1000.0000 mg | ORAL_TABLET | Freq: Once | ORAL | Status: AC
  Administered 2022-11-25: 1000 mg via ORAL

## 2022-11-25 NOTE — Discharge Instructions (Addendum)
Your urine did not show any signs of infection.  We have treated you in clinic for chlamydia with a single dose of azithromycin.  Abstain from intercourse for the next 7 days to allow for clearance of the infection from your system.  Will contact you if anything else results is abnormal on your cytology swab and start the appropriate treatment.  Return to clinic for any new or urgent symptoms.

## 2022-11-25 NOTE — ED Provider Notes (Signed)
[facility]    CSN: [id number] Arrival date & time: 11/25/22  1850      History   Chief Complaint Chief Complaint  Patient presents with   SEXUALLY TRANSMITTED DISEASE    HPI Mikayla Lopez is a 42 y.o. female.   Patient presents to clinic reporting exposure to chlamydia.  She had tested positive for little while ago and completed oral antibiotics.  She had told her partner, who reported he got treatment.  [facility] [place] [facility] and just told her that he tested positive for chlamydia, and that he has been sexually active with other people.  Patient has been having increased vaginal discharge with an odor.  Also having slight dysuria and discomfort with urinating.  The history is provided by the patient and medical records.    Past Medical History:  Diagnosis Date   Asthma    Gestational hypertension 05/05/2017   resolved after delivery   Hereditary disease in family possibly affecting fetus, fetus 1     Patient Active Problem List   Diagnosis Date Noted   Cornual pregnancy 07/29/2021   S/P cornual ectopic pregnancy surgery 07/29/2021    Past Surgical History:  Procedure Laterality Date   ADENOIDECTOMY     BILATERAL SALPINGECTOMY Right 07/29/2021   Procedure: OPEN RIGHT SALPINGECTOMY;  Surgeon: Tereso Newcomer, MD;  Location: [facility] OR;  Service: Gynecology;  Laterality: Right;   DIAGNOSTIC LAPAROSCOPY WITH REMOVAL OF ECTOPIC PREGNANCY N/A 07/29/2021   Procedure: DIAGNOSTIC LAPAROTOMY WITH REMOVAL OF ECTOPIC PREGNANCY;  Surgeon: Tereso Newcomer, MD;  Location: [facility] OR;  Service: Gynecology;  Laterality: N/A;   DILATION AND CURETTAGE OF UTERUS N/A 07/29/2021   Procedure: UTERINE CURETTAGE;  Surgeon: Tereso Newcomer, MD;  Location: [facility] OR;  Service: Gynecology;  Laterality: N/A;    OB History     Gravida  6   Para  4   Term  4   Preterm      AB  1   Living  4      SAB      IAB      Ectopic      Multiple  0   Live Births  4             Home Medications    Prior to Admission medications   Medication Sig Start Date End Date Taking? Authorizing Provider  FLUoxetine (PROZAC) 10 MG tablet Take 1 tablet (10 mg total) by mouth daily. 11/05/17   Elvina Sidle, MD  ibuprofen (ADVIL) 600 MG tablet Take 1 tablet (600 mg total) by mouth every 6 (six) hours. 07/31/21   Reva Bores, MD  mupirocin ointment (BACTROBAN) 2 % Apply 1 application topically 3 (three) times daily. 11/05/17   Elvina Sidle, MD  oxyCODONE-acetaminophen (PERCOCET/ROXICET) 5-325 MG tablet Take 1-2 tablets by mouth every 6 (six) hours as needed. 07/31/21   Reva Bores, MD  PROVENTIL HFA [id number] Base) MCG/ACT inhaler 2 PUFF EVERY 4 HOURS AS NEEDED INHALATION 12/09/16   [provider]    Family History History reviewed. No pertinent family history.  Social History Social History   Tobacco Use   Smoking status: Never   Smokeless tobacco: Never  Vaping Use   Vaping status: Former  Substance Use Topics   Alcohol use: Not Currently    Comment: Pt states socially   Drug use: Yes    Types: Marijuana    Comment: last use 07/27/2021     Allergies  Patient has no known allergies.   Review of Systems Review of Systems  Per HPI   Physical Exam Triage Vital Signs ED Triage Vitals  Encounter Vitals Group     BP 11/25/22 1914 (!) 166/77     Systolic BP Percentile --      Diastolic BP Percentile --      Pulse Rate 11/25/22 1914 (!) 58     Resp 11/25/22 1914 18     Temp 11/25/22 1914 98.2 F (36.8 C)     Temp Source 11/25/22 1914 Oral     SpO2 11/25/22 1914 99 %     Weight 11/25/22 1913 209 lb (94.8 kg)     Height 11/25/22 1913 5\' 3"  (1.6 m)     Head Circumference --      Peak Flow --      Pain Score 11/25/22 1912 0     Pain Loc --      Pain Education --      Exclude from Growth Chart --    No data found.  Updated Vital Signs BP (!) 166/77 (BP Location: Left Arm)   Pulse (!) 58   Temp 98.2 F (36.8 C) (Oral)   Resp  18   Ht 5\' 3"  (1.6 m)   Wt 209 lb (94.8 kg)   LMP 11/17/2022 (Approximate)   SpO2 99%   Breastfeeding No   BMI 37.02 kg/m   Visual Acuity Right Eye Distance:   Left Eye Distance:   Bilateral Distance:    Right Eye Near:   Left Eye Near:    Bilateral Near:     Physical Exam Vitals and nursing note reviewed.  Constitutional:      Appearance: Normal appearance.  HENT:     Head: Normocephalic and atraumatic.     Right Ear: External ear normal.     Left Ear: External ear normal.     Nose: Nose normal.     Mouth/Throat:     Mouth: Mucous membranes are moist.  Eyes:     Conjunctiva/sclera: Conjunctivae normal.  Cardiovascular:     Rate and Rhythm: Normal rate.  Pulmonary:     Effort: Pulmonary effort is normal. No respiratory distress.  Musculoskeletal:        General: Normal range of motion.  Skin:    General: Skin is warm and dry.  Neurological:     General: No focal deficit present.     Mental Status: She is alert and oriented to person, place, and time.  Psychiatric:        Mood and Affect: Mood normal.        Behavior: Behavior normal. Behavior is cooperative.      UC Treatments / Results  Labs (all labs ordered are listed, but only abnormal results are displayed) Labs Reviewed  POCT URINALYSIS DIP (MANUAL ENTRY) - Abnormal; Notable for the following components:      Result Value   Blood, UA trace-lysed (*)    All other components within normal limits  CERVICOVAGINAL ANCILLARY ONLY    EKG   Radiology No results found.  Procedures Procedures (including critical care time)  Medications Ordered in UC Medications  azithromycin (ZITHROMAX) tablet 1,000 mg (has no administration in time range)    Initial Impression / Assessment and Plan / UC Course  I have reviewed the triage vital signs and the nursing notes.  Pertinent labs & imaging results that were available during my care of the patient were reviewed by me and  considered in my medical  decision making (see chart for details).  Vitals and triage reviewed, patient is hemodynamically stable.  Partner tested positive for chlamydia, single dose of azithromycin given in clinic per patient preference.  Advised abstinence for the next 7 days.  Cytology swab obtained.  Declined HIV and syphilis screening.  Plan of care, follow-up care and return precautions given, no questions at this time.    Final Clinical Impressions(s) / UC Diagnoses   Final diagnoses:  Acute vaginitis  Exposure to sexually transmitted disease (STD)  Dysuria     Discharge Instructions      Your urine did not show any signs of infection.  We have treated you in clinic for chlamydia with a single dose of azithromycin.  Abstain from intercourse for the next 7 days to allow for clearance of the infection from your system.  Will contact you if anything else results is abnormal on your cytology swab and start the appropriate treatment.  Return to clinic for any new or urgent symptoms.     ED Prescriptions   None    PDMP not reviewed this encounter.   Henreitta Spittler, [place], [place] 11/25/22 1940

## 2022-11-25 NOTE — ED Triage Notes (Addendum)
Patient states her partner has chlamydia. Patient having vaginal discharge and odor onset 1 week. States she originally tested positive, they went back to the doctor and her partner still had chlamydia, they had still been active with each other as well.   Patient also having slight discomfort and urinary urgency.

## 2022-11-29 ENCOUNTER — Telehealth: Payer: Self-pay

## 2022-11-29 LAB — CERVICOVAGINAL ANCILLARY ONLY
Bacterial Vaginitis (gardnerella): POSITIVE — AB
Candida Glabrata: NEGATIVE
Candida Vaginitis: NEGATIVE
Chlamydia: NEGATIVE
Comment: NEGATIVE
Comment: NEGATIVE
Comment: NEGATIVE
Comment: NEGATIVE
Comment: NEGATIVE
Comment: NORMAL
Neisseria Gonorrhea: NEGATIVE
Trichomonas: NEGATIVE

## 2022-11-29 MED ORDER — METRONIDAZOLE 0.75 % VA GEL: 1.0000 | Freq: Every day | VAGINAL | 0 refills | Status: AC

## 2022-11-29 NOTE — Telephone Encounter (Signed)
Per protocol, pt requires tx with metronidazole. Reviewed with patient, verified pharmacy, prescription sent.
